# Patient Record
Sex: Female | Born: 1970 | Race: Black or African American | Hispanic: No | Smoking: Never smoker
Health system: Southern US, Community
[De-identification: ages and names within clinical notes are randomized; demographics above are authoritative.]

## PROBLEM LIST (undated history)

## (undated) DIAGNOSIS — T7840XA Allergy, unspecified, initial encounter: Secondary | ICD-10-CM

## (undated) DIAGNOSIS — I10 Essential (primary) hypertension: Secondary | ICD-10-CM

## (undated) DIAGNOSIS — M1990 Unspecified osteoarthritis, unspecified site: Secondary | ICD-10-CM

## (undated) DIAGNOSIS — E785 Hyperlipidemia, unspecified: Secondary | ICD-10-CM

## (undated) DIAGNOSIS — F419 Anxiety disorder, unspecified: Secondary | ICD-10-CM

## (undated) DIAGNOSIS — M4316 Spondylolisthesis, lumbar region: Secondary | ICD-10-CM

## (undated) DIAGNOSIS — J45909 Unspecified asthma, uncomplicated: Secondary | ICD-10-CM

## (undated) DIAGNOSIS — D649 Anemia, unspecified: Secondary | ICD-10-CM

## (undated) HISTORY — DX: Spondylolisthesis, lumbar region: M43.16

## (undated) HISTORY — DX: Allergy, unspecified, initial encounter: T78.40XA

## (undated) HISTORY — PX: COLONOSCOPY: SHX174

## (undated) HISTORY — PX: PARTIAL HYSTERECTOMY: SHX80

## (undated) HISTORY — DX: Anemia, unspecified: D64.9

## (undated) HISTORY — PX: OTHER SURGICAL HISTORY: SHX169

## (undated) HISTORY — DX: Hyperlipidemia, unspecified: E78.5

## (undated) HISTORY — DX: Unspecified asthma, uncomplicated: J45.909

## (undated) HISTORY — DX: Unspecified osteoarthritis, unspecified site: M19.90

## (undated) HISTORY — DX: Essential (primary) hypertension: I10

## (undated) HISTORY — PX: LUMBAR DISC SURGERY: SHX700

## (undated) HISTORY — DX: Anxiety disorder, unspecified: F41.9

---

## 2013-04-01 LAB — LIPID PANEL
Cholesterol: 194 mg/dL (ref 0–200)
HDL: 58 mg/dL (ref 35–70)
LDL Cholesterol: 118 mg/dL
Triglycerides: 125 mg/dL (ref 40–160)

## 2015-03-15 ENCOUNTER — Ambulatory Visit: Payer: Self-pay | Admitting: Family Medicine

## 2015-03-15 ENCOUNTER — Encounter: Payer: Self-pay | Admitting: Obstetrics & Gynecology

## 2015-04-19 ENCOUNTER — Encounter: Payer: Self-pay | Admitting: Family Medicine

## 2015-04-19 ENCOUNTER — Ambulatory Visit (INDEPENDENT_AMBULATORY_CARE_PROVIDER_SITE_OTHER): Payer: BLUE CROSS/BLUE SHIELD

## 2015-04-19 ENCOUNTER — Ambulatory Visit (INDEPENDENT_AMBULATORY_CARE_PROVIDER_SITE_OTHER): Payer: BLUE CROSS/BLUE SHIELD | Admitting: Family Medicine

## 2015-04-19 VITALS — BP 135/90 | HR 86 | Ht 64.0 in | Wt 150.0 lb

## 2015-04-19 DIAGNOSIS — Z1239 Encounter for other screening for malignant neoplasm of breast: Secondary | ICD-10-CM

## 2015-04-19 DIAGNOSIS — I10 Essential (primary) hypertension: Secondary | ICD-10-CM | POA: Diagnosis not present

## 2015-04-19 DIAGNOSIS — M5442 Lumbago with sciatica, left side: Secondary | ICD-10-CM

## 2015-04-19 DIAGNOSIS — Z90710 Acquired absence of both cervix and uterus: Secondary | ICD-10-CM

## 2015-04-19 DIAGNOSIS — M545 Low back pain, unspecified: Secondary | ICD-10-CM | POA: Insufficient documentation

## 2015-04-19 DIAGNOSIS — M4316 Spondylolisthesis, lumbar region: Secondary | ICD-10-CM | POA: Diagnosis not present

## 2015-04-19 DIAGNOSIS — R21 Rash and other nonspecific skin eruption: Secondary | ICD-10-CM

## 2015-04-19 MED ORDER — TRIAMCINOLONE ACETONIDE 0.5 % EX OINT: 1.0000 "application " | TOPICAL_OINTMENT | Freq: Two times a day (BID) | CUTANEOUS | Status: DC

## 2015-04-19 MED ORDER — CYCLOBENZAPRINE HCL 5 MG PO TABS
5.0000 mg | ORAL_TABLET | Freq: Three times a day (TID) | ORAL | Status: DC | PRN
Start: 2015-04-19 — End: 2019-01-20

## 2015-04-19 MED ORDER — HYDROCHLOROTHIAZIDE 25 MG PO TABS: 25.0000 mg | ORAL_TABLET | Freq: Every day | ORAL | Status: DC

## 2015-04-19 MED ORDER — AMLODIPINE BESYLATE 10 MG PO TABS: 10.0000 mg | ORAL_TABLET | Freq: Every day | ORAL | Status: DC

## 2015-04-19 NOTE — Assessment & Plan Note (Signed)
Likely due to SI joint dysfunction or lumbosacral strain. Patient may have a component of sciatica. Discussed options. Lumbar x-ray pending. Additionally we'll use Flexeril and Tylenol or ibuprofen for pain control. Refer to physical therapy. Return in 4 weeks for recheck and reevaluation

## 2015-04-19 NOTE — Assessment & Plan Note (Signed)
Restart hydrochlorothiazide. Return in one month for recheck blood pressure. Suggest fasting CMP and lipid and CBC pending

## 2015-04-19 NOTE — Assessment & Plan Note (Signed)
Unclear etiology at this time. Trial triamcinolone ointment

## 2015-04-19 NOTE — Progress Notes (Signed)
Lindsay George is a 44 y.o. female who presents to [facility]  today for establish care and address several medical issues.  1) hypertension: Patient notes hypertension typically controlled with amlodipine and hydrochlorothiazide. She's been out of her hydrochlorothiazide for several days now. No chest pains palpitations or shortness of breath.   2) low back pain: Patient notes left-sided low back pain. The pain radiates to the posterior left knee. This is been ongoing for years off and on but worsened over the last several months to weeks. She notes occasional numbness and pain radiating down the posterior leg. She denies any weakness bowel bladder dysfunction or difficulty walking. She typically takes Tylenol or Advil or Aleve at bedtime which seems to help some. The past she she is Flexeril which helped a lot.  3) rash: Patient notes an itchy rash on the right back. In the past she's had interlesional steroid injections which have helped very well. It's been a while since she's had an injection and would like one soon if possible.  4) breast cancer screening: Patient is due for mammogram. Her mother was diagnosed with breast cancer at 46 years old  51) cervical cancer screening. Patient has had a hysterectomy and no longer has a cervix. The hysterectomy was performed due to fibroids.   Past Medical History  Diagnosis Date  . Hypertension    History reviewed. No pertinent past surgical history. Social History  Substance Use Topics  . Smoking status: Never Smoker   . Smokeless tobacco: Not on file  . Alcohol Use: 0.0 oz/week    0 Standard drinks or equivalent per week   family history includes Hypertension in her brother, daughter, father, maternal aunt, maternal grandfather, maternal grandmother, maternal uncle, mother, paternal aunt, paternal grandfather, paternal grandmother, paternal uncle, sister, and son.  ROS as above Medications: Current  Outpatient Prescriptions  Medication Sig Dispense Refill  . amLODipine (NORVASC) 10 MG tablet Take 1 tablet (10 mg total) by mouth daily. 90 tablet 2  . cyclobenzaprine (FLEXERIL) 5 MG tablet Take 1 tablet (5 mg total) by mouth 3 (three) times daily as needed for muscle spasms. 90 tablet 1  . hydrochlorothiazide (HYDRODIURIL) 25 MG tablet Take 1 tablet (25 mg total) by mouth daily. 90 tablet 2  . triamcinolone ointment (KENALOG) 0.5 % Apply 1 application topically 2 (two) times daily. To affected area, avoid eyes and face 30 g 3   No current facility-administered medications for this visit.   No Known Allergies   Exam:  BP 135/90 mmHg  Pulse 86  Ht 5\' 4"  (1.626 m)  Wt 150 lb (68.04 kg)  BMI 25.73 kg/m2 Gen: Well NAD HEENT: EOMI,  MMM Lungs: Normal work of breathing. CTABL Heart: RRR no MRG Abd: NABS, Soft. Nondistended, Nontender Exts: Brisk capillary refill, warm and well perfused.  Back: Nontender to midline. Normal range of motion pain with flexion Tender palpation left SI joint. Negative Faber test bilaterally. Positive crossover pretzel stretch bilaterally. Negative straight leg raise test bilaterally. Lower extremity strength and reflexes are equal and normal bilaterally. Normal sensation throughout. Normal gait. Skin: Right posterior back small erythematous papule. Nontender.  No results found for this or any previous visit (from the past 24 hour(s)). No results found.   Please see individual assessment and plan sections.

## 2015-04-19 NOTE — Patient Instructions (Addendum)
Thank you for coming in today.  1) blood pressure: Take amlodipine and hydrochlorothiazide. Get fasting labs in the near future. Return in 4 weeks for recheck  2) back pain: Continue cyclobenzaprine. Take Tylenol or ibuprofen or Aleve for pain. Attend physical therapy. Return in 2-4 weeks. X-ray today  3) rash: Apply triamcinolone ointment as needed  4) get a mammogram. Will call with appointment.  Hypertension Hypertension, commonly called high blood pressure, is when the force of blood pumping through your arteries is too strong. Your arteries are the blood vessels that carry blood from your heart throughout your body. A blood pressure reading consists of a higher number over a lower number, such as 110/72. The higher number (systolic) is the pressure inside your arteries when your heart pumps. The lower number (diastolic) is the pressure inside your arteries when your heart relaxes. Ideally you want your blood pressure below 120/80. Hypertension forces your heart to work harder to pump blood. Your arteries may become narrow or stiff. Having hypertension puts you at risk for heart disease, stroke, and other problems.  RISK FACTORS Some risk factors for high blood pressure are controllable. Others are not.  Risk factors you cannot control include:   Race. You may be at higher risk if you are African American.  Age. Risk increases with age.  Gender. Men are at higher risk than women before age 15 years. After age 50, women are at higher risk than men. Risk factors you can control include:  Not getting enough exercise or physical activity.  Being overweight.  Getting too much fat, sugar, calories, or salt in your diet.  Drinking too much alcohol. SIGNS AND SYMPTOMS Hypertension does not usually cause signs or symptoms. Extremely high blood pressure (hypertensive crisis) may cause headache, anxiety, shortness of breath, and nosebleed. DIAGNOSIS  To check if you have hypertension, your  health care provider will measure your blood pressure while you are seated, with your arm held at the level of your heart. It should be measured at least twice using the same arm. Certain conditions can cause a difference in blood pressure between your right and left arms. A blood pressure reading that is higher than normal on one occasion does not mean that you need treatment. If one blood pressure reading is high, ask your health care provider about having it checked again. TREATMENT  Treating high blood pressure includes making lifestyle changes and possibly taking medicine. Living a healthy lifestyle can help lower high blood pressure. You may need to change some of your habits. Lifestyle changes may include:  Following the DASH diet. This diet is high in fruits, vegetables, and whole grains. It is low in salt, red meat, and added sugars.  Getting at least 2 hours of brisk physical activity every week.  Losing weight if necessary.  Not smoking.  Limiting alcoholic beverages.  Learning ways to reduce stress. If lifestyle changes are not enough to get your blood pressure under control, your health care provider may prescribe medicine. You may need to take more than one. Work closely with your health care provider to understand the risks and benefits. HOME CARE INSTRUCTIONS  Have your blood pressure rechecked as directed by your health care provider.   Take medicines only as directed by your health care provider. Follow the directions carefully. Blood pressure medicines must be taken as prescribed. The medicine does not work as well when you skip doses. Skipping doses also puts you at risk for problems.   Do  not smoke.   Monitor your blood pressure at home as directed by your health care provider. SEEK MEDICAL CARE IF:   You think you are having a reaction to medicines taken.  You have recurrent headaches or feel dizzy.  You have swelling in your ankles.  You have trouble  with your vision. SEEK IMMEDIATE MEDICAL CARE IF:  You develop a severe headache or confusion.  You have unusual weakness, numbness, or feel faint.  You have severe chest or abdominal pain.  You vomit repeatedly.  You have trouble breathing. MAKE SURE YOU:   Understand these instructions.  Will watch your condition.  Will get help right away if you are not doing well or get worse. Document Released: 08/11/2005 Document Revised: 12/26/2013 Document Reviewed: 06/03/2013 [facility] Patient Information 2015 [place], [place]. This information is not intended to replace advice given to you by your health care provider. Make sure you discuss any questions you have with your health care provider.

## 2015-04-19 NOTE — Assessment & Plan Note (Signed)
Mammogram ordered

## 2015-04-20 NOTE — Progress Notes (Signed)
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 Continue with PT. If not better MRI will be helpful ______

## 2015-05-03 ENCOUNTER — Ambulatory Visit (INDEPENDENT_AMBULATORY_CARE_PROVIDER_SITE_OTHER): Payer: BLUE CROSS/BLUE SHIELD | Admitting: Rehabilitative and Restorative Service Providers"

## 2015-05-03 ENCOUNTER — Encounter: Payer: Self-pay | Admitting: Rehabilitative and Restorative Service Providers"

## 2015-05-03 DIAGNOSIS — M545 Low back pain, unspecified: Secondary | ICD-10-CM

## 2015-05-03 DIAGNOSIS — M79605 Pain in left leg: Principal | ICD-10-CM
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
## 2015-05-11 ENCOUNTER — Encounter: Payer: Self-pay | Admitting: Family Medicine

## 2015-05-11 ENCOUNTER — Ambulatory Visit (INDEPENDENT_AMBULATORY_CARE_PROVIDER_SITE_OTHER): Payer: BLUE CROSS/BLUE SHIELD | Admitting: Family Medicine

## 2015-05-11 VITALS — BP 142/94 | HR 65 | Wt 151.0 lb

## 2015-05-11 DIAGNOSIS — M545 Low back pain, unspecified: Secondary | ICD-10-CM

## 2015-05-11 DIAGNOSIS — M4306 Spondylolysis, lumbar region: Secondary | ICD-10-CM | POA: Diagnosis not present

## 2015-05-11 DIAGNOSIS — M7072 Other bursitis of hip, left hip: Secondary | ICD-10-CM

## 2015-05-11 NOTE — Patient Instructions (Signed)
Thank you for coming in today. Call or go to the ER if you develop a large red swollen joint with extreme pain or oozing puss.  Return in 1-2 weeks.   Hip Bursitis Bursitis is a swelling and soreness (inflammation) of a fluid-filled sac (bursa). This sac overlies and protects the joints.  CAUSES   Injury.  Overuse of the muscles surrounding the joint.  Arthritis.  Gout.  Infection.  Cold weather.  Inadequate warm-up and conditioning prior to activities. The cause may not be known.  SYMPTOMS   Mild to severe irritation.  Tenderness and swelling over the outside of the hip.  Pain with motion of the hip.  If the bursa becomes infected, a fever may be present. Redness, tenderness, and warmth will develop over the hip. Symptoms usually lessen in 3 to 4 weeks with treatment, but can come back. TREATMENT If conservative treatment does not work, your caregiver may advise draining the bursa and injecting cortisone into the area. This may speed up the healing process. This may also be used as an initial treatment of choice. HOME CARE INSTRUCTIONS   Apply ice to the affected area for 15-20 minutes every 3 to 4 hours while awake for the first 2 days. Put the ice in a plastic bag and place a towel between the bag of ice and your skin.  Rest the painful joint as much as possible, but continue to put the joint through a normal range of motion at least 4 times per day. When the pain lessens, begin normal, slow movements and usual activities to help prevent stiffness of the hip.  Only take over-the-counter or prescription medicines for pain, discomfort, or fever as directed by your caregiver.  Use crutches to limit weight bearing on the hip joint, if advised.  Elevate your painful hip to reduce swelling. Use pillows for propping and cushioning your legs and hips.  Gentle massage may provide comfort and decrease swelling. SEEK IMMEDIATE MEDICAL CARE IF:   Your pain increases even  during treatment, or you are not improving.  You have a fever.  You have heat and inflammation over the involved bursa.  You have any other questions or concerns. MAKE SURE YOU:   Understand these instructions.  Will watch your condition.  Will get help right away if you are not doing well or get worse. Document Released: 01/31/2002 Document Revised: 11/03/2011 Document Reviewed: 08/30/2008 [facility] Patient Information 2015 [place], [place]. This information is not intended to replace advice given to you by your health care provider. Make sure you discuss any questions you have with your health care provider.

## 2015-05-11 NOTE — Assessment & Plan Note (Signed)
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 I still think this is causing most of her pain.

## 2015-05-11 NOTE — Assessment & Plan Note (Signed)
Likely not a predominant pain generator. Plan for watchful waiting. We'll obtain MRI if needed.

## 2015-05-11 NOTE — Progress Notes (Signed)
Lindsay George is a 44 y.o. female who presents to [facility]  today for Back and hip pain. Patient has an ongoing history of left-sided low back pain and left lateral hip pain. Symptoms present for years but worsened recently. Patient was seen in the clinic over a month ago. She notes that she does not want to try physical therapy assistant in the past and has not helped. She would like to have an injection today if possible. The majority of her pain is in her left lateral hip. She denies anyfevers chills nausea vomiting or diarrhea.   Past Medical History  Diagnosis Date  . Hypertension    No past surgical history on file. Social History  Substance Use Topics  . Smoking status: Never Smoker   . Smokeless tobacco: Not on file  . Alcohol Use: 0.0 oz/week    0 Standard drinks or equivalent per week   family history includes Breast cancer (age of onset: 108) in her mother; Hypertension in her brother, daughter, father, maternal aunt, maternal grandfather, maternal grandmother, maternal uncle, mother, paternal aunt, paternal grandfather, paternal grandmother, paternal uncle, sister, and son.  ROS as above Medications: Current Outpatient Prescriptions  Medication Sig Dispense Refill  . amLODipine (NORVASC) 10 MG tablet Take 1 tablet (10 mg total) by mouth daily. 90 tablet 2  . cyclobenzaprine (FLEXERIL) 5 MG tablet Take 1 tablet (5 mg total) by mouth 3 (three) times daily as needed for muscle spasms. 90 tablet 1  . hydrochlorothiazide (HYDRODIURIL) 25 MG tablet Take 1 tablet (25 mg total) by mouth daily. 90 tablet 2  . triamcinolone ointment (KENALOG) 0.5 % Apply 1 application topically 2 (two) times daily. To affected area, avoid eyes and face 30 g 3   No current facility-administered medications for this visit.   No Known Allergies   Exam:  BP 142/94 mmHg  Pulse 65  Wt 151 lb (68.493 kg) Gen: Well NAD HEENT: EOMI,  MMM Lungs: Normal work of  breathing. CTABL Heart: RRR no MRG Abd: NABS, Soft. Nondistended, Nontender Exts: Brisk capillary refill, warm and well perfused.  Back: Nontender to midline. Mildly tender palpation left SI joint. Low back range of motion is normal. Mildly positive left pretzel stretch. Negative Fabere and straight leg raise test bilaterally. Hip normal range of motion. Tender to palpation left lateral greater trochanter. Hip abduction strength is decreased on the left 4+/5. Lower extremity reflexes and strength are intact and equal and normal bilaterally with the exception of hip abduction strength.  Procedure note greater trochanteric bursa injection: Left Consent obtained and timeout performed.  Patient laying on side with affected hip up.   Area located and marked.   Skin cleaned with alcohol and chlorhexidine, and cold spray applied Using a spinal needle the greater trochanteric bursa was accessed and the needle tip was touched to the femur.   40 mg of Kenalog and 4 mL of Marcaine were injected into a wheel pattern.  Patient tolerated procedure well with no weakness or numbness or bleeding.     No results found for this or any previous visit (from the past 24 hour(s)). No results found.   Please see individual assessment and plan sections.

## 2015-05-11 NOTE — Assessment & Plan Note (Signed)
Greater trochanteric bursitis treated with injection today. Work on home exercises return in one week.

## 2015-05-12 LAB — CBC
HCT: 38.7 % (ref 36.0–46.0)
Hemoglobin: 13.1 g/dL (ref 12.0–15.0)
MCH: 29.9 pg (ref 26.0–34.0)
MCHC: 33.9 g/dL (ref 30.0–36.0)
MCV: 88.4 fL (ref 78.0–100.0)
MPV: 9.1 fL (ref 8.6–12.4)
PLATELETS: 306 10*3/uL (ref 150–400)
RBC: 4.38 MIL/uL (ref 3.87–5.11)
RDW: 16.7 % — AB (ref 11.5–15.5)
WBC: 7.5 10*3/uL (ref 4.0–10.5)

## 2015-05-12 LAB — COMPREHENSIVE METABOLIC PANEL
ALT: 9 U/L (ref 6–29)
AST: 12 U/L (ref 10–35)
Albumin: 4 g/dL (ref 3.6–5.1)
Alkaline Phosphatase: 64 U/L (ref 33–115)
BUN: 15 mg/dL (ref 7–25)
CALCIUM: 9.4 mg/dL (ref 8.6–10.2)
CO2: 29 mmol/L (ref 20–31)
Chloride: 103 mmol/L (ref 98–110)
Creat: 0.81 mg/dL (ref 0.50–1.10)
GLUCOSE: 86 mg/dL (ref 65–99)
POTASSIUM: 3 mmol/L — AB (ref 3.5–5.3)
Sodium: 140 mmol/L (ref 135–146)
Total Bilirubin: 0.4 mg/dL (ref 0.2–1.2)
Total Protein: 6.6 g/dL (ref 6.1–8.1)

## 2015-05-12 LAB — LIPID PANEL
CHOL/HDL RATIO: 3.4 ratio (ref <=5.0)
Cholesterol: 188 mg/dL (ref 125–200)
HDL: 55 mg/dL (ref >=46)
LDL CALC: 115 mg/dL (ref <130)
Triglycerides: 90 mg/dL (ref <150)
VLDL: 18 mg/dL (ref <30)

## 2015-05-14 NOTE — Progress Notes (Signed)
Quick Note:  Labs show slightly high cholesterol and low potassium.  Will discuss what we need to do at the next visit.  May consider start lipitor. ______

## 2015-05-17 ENCOUNTER — Ambulatory Visit: Payer: BLUE CROSS/BLUE SHIELD | Admitting: Family Medicine

## 2015-05-25 ENCOUNTER — Ambulatory Visit: Payer: BLUE CROSS/BLUE SHIELD | Admitting: Family Medicine

## 2015-05-31 ENCOUNTER — Encounter: Payer: Self-pay | Admitting: Family Medicine

## 2015-05-31 DIAGNOSIS — L910 Hypertrophic scar: Secondary | ICD-10-CM | POA: Insufficient documentation

## 2015-06-05 ENCOUNTER — Encounter: Payer: Self-pay | Admitting: Family Medicine

## 2015-07-02 ENCOUNTER — Ambulatory Visit: Payer: BLUE CROSS/BLUE SHIELD | Admitting: Family Medicine

## 2015-07-03 ENCOUNTER — Ambulatory Visit (INDEPENDENT_AMBULATORY_CARE_PROVIDER_SITE_OTHER): Payer: BLUE CROSS/BLUE SHIELD | Admitting: Family Medicine

## 2015-07-03 ENCOUNTER — Encounter: Payer: Self-pay | Admitting: Family Medicine

## 2015-07-03 ENCOUNTER — Ambulatory Visit: Payer: BLUE CROSS/BLUE SHIELD | Admitting: Family Medicine

## 2015-07-03 VITALS — BP 138/87 | HR 79 | Wt 147.0 lb

## 2015-07-03 DIAGNOSIS — M545 Low back pain, unspecified: Secondary | ICD-10-CM

## 2015-07-03 DIAGNOSIS — M7072 Other bursitis of hip, left hip: Secondary | ICD-10-CM

## 2015-07-03 DIAGNOSIS — M4306 Spondylolysis, lumbar region: Secondary | ICD-10-CM

## 2015-07-03 MED ORDER — TRAMADOL HCL 50 MG PO TABS: 50.0000 mg | ORAL_TABLET | Freq: Three times a day (TID) | ORAL | Status: DC | PRN

## 2015-07-03 NOTE — Progress Notes (Signed)
Lindsay George is a 44 y.o. female who presents to [facility]  today for back and hip pain. Patient was last seen in middle September for back and hip pain.   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 Additionally she was found to have greater trochanteric bursitis. She was not interested in physical therapy. In September she received an injection for her greater trochanter which helped up until recently. She did some home exercises which have been unhelpful little. Her pain in her lateral hip has returned. She notes severe pain in the lateral hip causes pain with laying on her left side and unsteady from a seated position. Additionally she notes the pain radiating to the lateral knee. She notes infrequent pain radiating to the foot. She denies any weakness or numbness bowel bladder dysfunction.   Past Medical History  Diagnosis Date  . Hypertension   . History of hysterectomy    Past Surgical History  Procedure Laterality Date  . [place] rso 2012 for uterine fibroids     Social History  Substance Use Topics  . Smoking status: Never Smoker   . Smokeless tobacco: Not on file  . Alcohol Use: 0.0 oz/week    0 Standard drinks or equivalent per week   family history includes Breast cancer (age of onset: 86) in her mother; Hypertension in her brother, daughter, father, maternal aunt, maternal grandfather, maternal grandmother, maternal uncle, mother, paternal aunt, paternal grandfather, paternal grandmother, paternal uncle, sister, and son.  ROS as above Medications: Current Outpatient Prescriptions  Medication Sig Dispense Refill  . amLODipine (NORVASC) 10 MG tablet Take 1 tablet (10 mg total) by mouth daily. 90 tablet 2  . cyclobenzaprine (FLEXERIL) 5 MG tablet Take 1 tablet (5 mg total) by mouth 3 (three) times daily as needed for muscle spasms. 90 tablet 1  . hydrochlorothiazide (HYDRODIURIL) 25 MG tablet Take 1 tablet (25 mg  total) by mouth daily. 90 tablet 2  . triamcinolone ointment (KENALOG) 0.5 % Apply 1 application topically 2 (two) times daily. To affected area, avoid eyes and face 30 g 3  . traMADol (ULTRAM) 50 MG tablet Take 1 tablet (50 mg total) by mouth every 8 (eight) hours as needed. 30 tablet 0   No current facility-administered medications for this visit.   Allergies  Allergen Reactions  . Ace Inhibitors Cough    Cough     Exam:  BP 138/87 mmHg  Pulse 79  Wt 147 lb (66.679 kg) Gen: Well NAD HEENT: EOMI,  MMM Lungs: Normal work of breathing. CTABL Heart: RRR no MRG Abd: NABS, Soft. Nondistended, Nontender Exts: Brisk capillary refill, warm and well perfused.  Back nontender to spinal midline.  Hip normal range of motion bilaterally. Left hip is tender to palpation overlying the greater trochanter. Hip abduction strength is 4/5 left. Pulses capillary refill sensation are intact distally. Antalgic gait  Greater trochanteric bursa injection: Left Consent obtained and timeout performed.  Patient laying on side with affected hip up.   Area located and marked.   Skin cleaned with rubbing alcohol and chlorhexidine, and cold spray applied Using a spinal needle the greater trochanteric bursa was accessed.   40 mg of Kenalog and 4 mL of Marcaine were injected in a wheel pattern.   Immediate improvement following injection:  Patient tolerated procedure well with no weakness or numbness or bleeding.     No results found for this or any previous visit (from  the past 24 hour(s)). No results found.   Please see individual assessment and plan sections.

## 2015-07-03 NOTE — Assessment & Plan Note (Signed)
Greater trochanteric bursitis returned. I don't think patient was able to do sufficient amount of home exercises and therapy. She agrees to attend formal physical therapy. Additionally will work on core strength for pars defect. Treat pain with tramadol. Return in a few weeks.

## 2015-07-03 NOTE — Patient Instructions (Signed)
Thank you for coming in today. Come back or go to the emergency room if you notice new weakness new numbness problems walking or bowel or bladder problems. Attend physical therapy.  Take tramadol for severe pain.  Return in a few weeks.

## 2015-07-09 ENCOUNTER — Telehealth: Payer: Self-pay | Admitting: Family Medicine

## 2015-07-09 NOTE — Telephone Encounter (Signed)
Refill not appropriate. Reason: pt has requested refill too soon.  Left detailed VM notifying pt that she should have plenty of refills at her pharmacy and that she should contact her pharmacy to have the rx refilled.

## 2015-07-09 NOTE — Telephone Encounter (Signed)
Pt called for refill on Amlodipine. She uses [facility] on [address] in [place]. Thanks.

## 2015-07-27 ENCOUNTER — Ambulatory Visit (INDEPENDENT_AMBULATORY_CARE_PROVIDER_SITE_OTHER): Payer: BLUE CROSS/BLUE SHIELD | Admitting: Rehabilitative and Restorative Service Providers"

## 2015-07-27 ENCOUNTER — Encounter: Payer: Self-pay | Admitting: Rehabilitative and Restorative Service Providers"

## 2015-07-27 DIAGNOSIS — M545 Low back pain, unspecified: Secondary | ICD-10-CM

## 2015-07-27 DIAGNOSIS — R6889 Other general symptoms and signs: Secondary | ICD-10-CM

## 2015-07-27 DIAGNOSIS — Z7409 Other reduced mobility: Secondary | ICD-10-CM

## 2015-07-27 DIAGNOSIS — M79605 Pain in left leg: Principal | ICD-10-CM

## 2015-07-27 DIAGNOSIS — M623 Immobility syndrome (paraplegic): Secondary | ICD-10-CM

## 2015-07-27 DIAGNOSIS — R531 Weakness: Secondary | ICD-10-CM

## 2015-07-27 DIAGNOSIS — M2560 Stiffness of unspecified joint, not elsewhere classified: Secondary | ICD-10-CM
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
## 2015-07-27 NOTE — Patient Instructions (Signed)
Ball massage work   Abdominal Bracing With Pelvic Floor (Hook-Lying)    With neutral spine, tighten pelvic floor and abdominals sucking belly button to back bone; tighten muscles in back at wiast. Hold 10 sec  Repeat _10 times. Do __several_ times a day. Progress to do this sitting; standing and walking   Trunk: Prone Extension (Press-Ups)    Lie on stomach on firm, flat surface. Relax bottom and legs. Raise chest in air with elbows straight. Keep hips flat on surface, sag stomach. Hold _2-3___ seconds. Repeat _10___ times. Do __2-3_ sessions per day. CAUTION: Movement should be gentle and slow.  KNEE: Quadriceps - Prone    Place strap around ankle. Bring ankle toward buttocks. Press hip into surface. Hold _30__ seconds. __3_ reps per set, _2-3__ sets per day

## 2015-08-02 ENCOUNTER — Encounter: Payer: Self-pay | Admitting: Family Medicine

## 2015-08-02 ENCOUNTER — Ambulatory Visit (INDEPENDENT_AMBULATORY_CARE_PROVIDER_SITE_OTHER): Payer: BLUE CROSS/BLUE SHIELD | Admitting: Family Medicine

## 2015-08-02 DIAGNOSIS — M7072 Other bursitis of hip, left hip: Secondary | ICD-10-CM

## 2015-08-02 NOTE — Progress Notes (Signed)
No show

## 2015-11-08 ENCOUNTER — Ambulatory Visit (INDEPENDENT_AMBULATORY_CARE_PROVIDER_SITE_OTHER): Payer: BLUE CROSS/BLUE SHIELD | Admitting: Family Medicine

## 2015-11-08 ENCOUNTER — Encounter: Payer: Self-pay | Admitting: Family Medicine

## 2015-11-08 VITALS — BP 169/104 | HR 81 | Temp 98.1°F | Wt 152.0 lb

## 2015-11-08 DIAGNOSIS — R6889 Other general symptoms and signs: Secondary | ICD-10-CM

## 2015-11-08 LAB — POCT INFLUENZA A/B
INFLUENZA B, POC: NEGATIVE
Influenza A, POC: NEGATIVE

## 2015-11-08 MED ORDER — PREDNISONE 10 MG PO TABS: 30.0000 mg | ORAL_TABLET | Freq: Every day | ORAL | Status: DC

## 2015-11-08 MED ORDER — AZITHROMYCIN 250 MG PO TABS: 250.0000 mg | ORAL_TABLET | Freq: Every day | ORAL | Status: DC

## 2015-11-08 MED ORDER — IPRATROPIUM BROMIDE 0.06 % NA SOLN: 2.0000 | NASAL | Status: DC | PRN

## 2015-11-08 MED ORDER — GUAIFENESIN-CODEINE 100-10 MG/5ML PO SOLN: 5.0000 mL | Freq: Every evening | ORAL | Status: DC | PRN

## 2015-11-08 NOTE — Progress Notes (Signed)
       Lindsay George is a 45 y.o. female who presents to [facility] today for cough congestion and runny nose and watery eyes body aches chills and subjective fevers. Symptoms present for 2 days. Additionally patient has right-sided ear pain and pressure and decreased hearing. Patient has tried multiple over-the-counter cough and cold medicines which probably helped a little. She did not take her blood pressure medications this morning.   Past Medical History  Diagnosis Date  . Hypertension   . History of hysterectomy    Past Surgical History  Procedure Laterality Date  . Tah rso 2012 for uterine fibroids     Social History  Substance Use Topics  . Smoking status: Never Smoker   . Smokeless tobacco: Not on file  . Alcohol Use: 0.0 oz/week    0 Standard drinks or equivalent per week   family history includes Breast cancer (age of onset: 57) in her mother; Hypertension in her brother, daughter, father, maternal aunt, maternal grandfather, maternal grandmother, maternal uncle, mother, paternal aunt, paternal grandfather, paternal grandmother, paternal uncle, sister, and son.  ROS as above Medications: Current Outpatient Prescriptions  Medication Sig Dispense Refill  . amLODipine (NORVASC) 10 MG tablet Take 1 tablet (10 mg total) by mouth daily. 90 tablet 2  . cyclobenzaprine (FLEXERIL) 5 MG tablet Take 1 tablet (5 mg total) by mouth 3 (three) times daily as needed for muscle spasms. 90 tablet 1  . hydrochlorothiazide (HYDRODIURIL) 25 MG tablet Take 1 tablet (25 mg total) by mouth daily. 90 tablet 2  . traMADol (ULTRAM) 50 MG tablet Take 1 tablet (50 mg total) by mouth every 8 (eight) hours as needed. 30 tablet 0  . triamcinolone ointment (KENALOG) 0.5 % Apply 1 application topically 2 (two) times daily. To affected area, avoid eyes and face 30 g 3   No current facility-administered  medications for this visit.   Allergies  Allergen Reactions  . Ace Inhibitors Cough    Cough     Exam:  BP 169/104 mmHg  Pulse 81  Temp(Src) 98.1 F (36.7 C) (Oral)  Wt 152 lb (68.947 kg)  SpO2 100% Gen: Well NAD Nontoxic appearing HEENT: EOMI,  MMM clear nasal discharge. Posterior pharynx with cobblestoning. Normal tympanic membranes bilaterally. Cervical lymphadenopathy present bilaterally Lungs: Normal work of breathing. CTABL Heart: RRR no MRG Abd: NABS, Soft. Nondistended, Nontender Exts: Brisk capillary refill, warm and well perfused.   Point of care rapid influenza test: negative  No results found for this or any previous visit (from the past 24 hour(s)). No results found.    45 year old woman With viral URI. Flu test negative.  Treatment with prednisone and Atrovent nasal spray and codeine cough syrup. Use azithromycin and Avelox if not better.

## 2015-11-08 NOTE — Patient Instructions (Signed)
Thank you for coming in today. Call or go to the emergency room if you get worse, have trouble breathing, have chest pains, or palpitations.  Take the prednisone daily.  Use the nasal spray and cough medicine as needed.  Take azithromycin antibiotics if not getting better.  Take tylenol for pain and fever.  Take your blood pressure medicines.   Upper Respiratory Infection, Adult Most upper respiratory infections (URIs) are a viral infection of the air passages leading to the lungs. A URI affects the nose, throat, and upper air passages. The most common type of URI is nasopharyngitis and is typically referred to as "the common cold." URIs run their course and usually go away on their own. Most of the time, a URI does not require medical attention, but sometimes a bacterial infection in the upper airways can follow a viral infection. This is called a secondary infection. Sinus and middle ear infections are common types of secondary upper respiratory infections. Bacterial pneumonia can also complicate a URI. A URI can worsen asthma and chronic obstructive pulmonary disease (COPD). Sometimes, these complications can require emergency medical care and may be life threatening.  CAUSES Almost all URIs are caused by viruses. A virus is a type of germ and can spread from one person to another.  RISKS FACTORS You may be at risk for a URI if:   You smoke.   You have chronic heart or lung disease.  You have a weakened defense (immune) system.   You are very young or very old.   You have nasal allergies or asthma.  You work in crowded or poorly ventilated areas.  You work in health care facilities or schools. SIGNS AND SYMPTOMS  Symptoms typically develop 2-3 days after you come in contact with a cold virus. Most viral URIs last 7-10 days. However, viral URIs from the influenza virus (flu virus) can last 14-18 days and are typically more severe. Symptoms may include:   Runny or stuffy  (congested) nose.   Sneezing.   Cough.   Sore throat.   Headache.   Fatigue.   Fever.   Loss of appetite.   Pain in your forehead, behind your eyes, and over your cheekbones (sinus pain).  Muscle aches.  DIAGNOSIS  Your health care provider may diagnose a URI by:  Physical exam.  Tests to check that your symptoms are not due to another condition such as:  Strep throat.  Sinusitis.  Pneumonia.  Asthma. TREATMENT  A URI goes away on its own with time. It cannot be cured with medicines, but medicines may be prescribed or recommended to relieve symptoms. Medicines may help:  Reduce your fever.  Reduce your cough.  Relieve nasal congestion. HOME CARE INSTRUCTIONS   Take medicines only as directed by your health care provider.   Gargle warm saltwater or take cough drops to comfort your throat as directed by your health care provider.  Use a warm mist humidifier or inhale steam from a shower to increase air moisture. This may make it easier to breathe.  Drink enough fluid to keep your urine clear or pale yellow.   Eat soups and other clear broths and maintain good nutrition.   Rest as needed.   Return to work when your temperature has returned to normal or as your health care provider advises. You may need to stay home longer to avoid infecting others. You can also use a face mask and careful hand washing to prevent spread of the virus.  Increase the usage of your inhaler if you have asthma.   Do not use any tobacco products, including cigarettes, chewing tobacco, or electronic cigarettes. If you need help quitting, ask your health care provider. PREVENTION  The best way to protect yourself from getting a cold is to practice good hygiene.   Avoid oral or hand contact with people with cold symptoms.   Wash your hands often if contact occurs.  There is no clear evidence that vitamin C, vitamin E, echinacea, or exercise reduces the chance of  developing a cold. However, it is always recommended to get plenty of rest, exercise, and practice good nutrition.  SEEK MEDICAL CARE IF:   You are getting worse rather than better.   Your symptoms are not controlled by medicine.   You have chills.  You have worsening shortness of breath.  You have brown or red mucus.  You have yellow or brown nasal discharge.  You have pain in your face, especially when you bend forward.  You have a fever.  You have swollen neck glands.  You have pain while swallowing.  You have white areas in the back of your throat. SEEK IMMEDIATE MEDICAL CARE IF:   You have severe or persistent:  Headache.  Ear pain.  Sinus pain.  Chest pain.  You have chronic lung disease and any of the following:  Wheezing.  Prolonged cough.  Coughing up blood.  A change in your usual mucus.  You have a stiff neck.  You have changes in your:  Vision.  Hearing.  Thinking.  Mood. MAKE SURE YOU:   Understand these instructions.  Will watch your condition.  Will get help right away if you are not doing well or get worse.   This information is not intended to replace advice given to you by your health care provider. Make sure you discuss any questions you have with your health care provider.   Document Released: 02/04/2001 Document Revised: 12/26/2014 Document Reviewed: 11/16/2013 Elsevier Interactive Patient Education [employer].

## 2015-11-21 ENCOUNTER — Other Ambulatory Visit: Payer: Self-pay | Admitting: Family Medicine

## 2015-11-21 MED ORDER — FLUCONAZOLE 150 MG PO TABS: ORAL_TABLET | ORAL | Status: DC

## 2016-05-24 ENCOUNTER — Other Ambulatory Visit: Payer: Self-pay | Admitting: Family Medicine

## 2016-06-06 ENCOUNTER — Other Ambulatory Visit: Payer: Self-pay | Admitting: Family Medicine

## 2016-06-13 ENCOUNTER — Ambulatory Visit (INDEPENDENT_AMBULATORY_CARE_PROVIDER_SITE_OTHER): Payer: BLUE CROSS/BLUE SHIELD

## 2016-06-13 DIAGNOSIS — R921 Mammographic calcification found on diagnostic imaging of breast: Secondary | ICD-10-CM | POA: Diagnosis not present

## 2016-06-24 ENCOUNTER — Telehealth: Payer: Self-pay | Admitting: Family Medicine

## 2016-06-24 DIAGNOSIS — R928 Other abnormal and inconclusive findings on diagnostic imaging of breast: Secondary | ICD-10-CM

## 2016-06-24 NOTE — Telephone Encounter (Signed)
Screening mammogram abnormal. Diagnostic mammogram and ultrasound ordered.

## 2016-06-24 NOTE — Telephone Encounter (Signed)
Pt.notified

## 2016-07-15 ENCOUNTER — Other Ambulatory Visit: Payer: Self-pay | Admitting: Family Medicine

## 2016-07-15 ENCOUNTER — Ambulatory Visit
Admission: RE | Admit: 2016-07-15 | Discharge: 2016-07-15 | Disposition: A | Discharge status: Home / Self Care | Payer: BLUE CROSS/BLUE SHIELD | Source: Ambulatory Visit | Attending: Family Medicine | Admitting: Family Medicine

## 2016-07-15 DIAGNOSIS — R928 Other abnormal and inconclusive findings on diagnostic imaging of breast: Secondary | ICD-10-CM

## 2016-07-22 ENCOUNTER — Other Ambulatory Visit: Payer: Self-pay | Admitting: Family Medicine

## 2016-07-22 ENCOUNTER — Ambulatory Visit
Admission: RE | Admit: 2016-07-22 | Discharge: 2016-07-22 | Disposition: A | Discharge status: Home / Self Care | Payer: BLUE CROSS/BLUE SHIELD | Source: Ambulatory Visit | Attending: Family Medicine | Admitting: Family Medicine

## 2016-07-22 DIAGNOSIS — R928 Other abnormal and inconclusive findings on diagnostic imaging of breast: Secondary | ICD-10-CM

## 2016-07-22 DIAGNOSIS — R921 Mammographic calcification found on diagnostic imaging of breast: Secondary | ICD-10-CM

## 2016-08-15 ENCOUNTER — Encounter: Payer: Self-pay | Admitting: Family Medicine

## 2016-08-15 ENCOUNTER — Ambulatory Visit (INDEPENDENT_AMBULATORY_CARE_PROVIDER_SITE_OTHER): Payer: BLUE CROSS/BLUE SHIELD | Admitting: Family Medicine

## 2016-08-15 VITALS — BP 164/95 | HR 84 | Temp 98.1°F | Wt 154.0 lb

## 2016-08-15 DIAGNOSIS — B9789 Other viral agents as the cause of diseases classified elsewhere: Secondary | ICD-10-CM

## 2016-08-15 DIAGNOSIS — I10 Essential (primary) hypertension: Secondary | ICD-10-CM | POA: Diagnosis not present

## 2016-08-15 DIAGNOSIS — J069 Acute upper respiratory infection, unspecified: Secondary | ICD-10-CM | POA: Diagnosis not present

## 2016-08-15 DIAGNOSIS — M4306 Spondylolysis, lumbar region: Secondary | ICD-10-CM

## 2016-08-15 DIAGNOSIS — G4700 Insomnia, unspecified: Secondary | ICD-10-CM | POA: Diagnosis not present

## 2016-08-15 MED ORDER — AZITHROMYCIN 250 MG PO TABS: 250.0000 mg | ORAL_TABLET | Freq: Every day | ORAL | 0 refills | Status: DC

## 2016-08-15 MED ORDER — TRAZODONE HCL 50 MG PO TABS: 25.0000 mg | ORAL_TABLET | Freq: Every evening | ORAL | 3 refills | Status: DC | PRN

## 2016-08-15 MED ORDER — AMLODIPINE BESYLATE 10 MG PO TABS: 10.0000 mg | ORAL_TABLET | Freq: Every day | ORAL | 0 refills | Status: DC

## 2016-08-15 MED ORDER — HYDROCHLOROTHIAZIDE 25 MG PO TABS: 25.0000 mg | ORAL_TABLET | Freq: Every day | ORAL | 1 refills | Status: DC

## 2016-08-15 MED ORDER — PREDNISONE 10 MG PO TABS: 30.0000 mg | ORAL_TABLET | Freq: Every day | ORAL | 0 refills | Status: DC

## 2016-08-15 NOTE — Patient Instructions (Signed)
Thank you for coming in today. Attend PT.   Restart Blood pressure medicines.   Take the predisone and azithromycin if not better.   Get labs before you come back.   Return for recheck in about 1 month.    Upper Respiratory Infection, Adult Most upper respiratory infections (URIs) are caused by a virus. A URI affects the nose, throat, and upper air passages. The most common type of URI is often called "the common cold." Follow these instructions at home:  Take medicines only as told by your doctor.  Gargle warm saltwater or take cough drops to comfort your throat as told by your doctor.  Use a warm mist humidifier or inhale steam from a shower to increase air moisture. This may make it easier to breathe.  Drink enough fluid to keep your pee (urine) clear or pale yellow.  Eat soups and other clear broths.  Have a healthy diet.  Rest as needed.  Go back to work when your fever is gone or your doctor says it is okay.  You may need to stay home longer to avoid giving your URI to others.  You can also wear a face mask and wash your hands often to prevent spread of the virus.  Use your inhaler more if you have asthma.  Do not use any tobacco products, including cigarettes, chewing tobacco, or electronic cigarettes. If you need help quitting, ask your doctor. Contact a doctor if:  You are getting worse, not better.  Your symptoms are not helped by medicine.  You have chills.  You are getting more short of breath.  You have brown or red mucus.  You have yellow or brown discharge from your nose.  You have pain in your face, especially when you bend forward.  You have a fever.  You have puffy (swollen) neck glands.  You have pain while swallowing.  You have white areas in the back of your throat. Get help right away if:  You have very bad or constant:  Headache.  Ear pain.  Pain in your forehead, behind your eyes, and over your cheekbones (sinus  pain).  Chest pain.  You have long-lasting (chronic) lung disease and any of the following:  Wheezing.  Long-lasting cough.  Coughing up blood.  A change in your usual mucus.  You have a stiff neck.  You have changes in your:  Vision.  Hearing.  Thinking.  Mood. This information is not intended to replace advice given to you by your health care provider. Make sure you discuss any questions you have with your health care provider. Document Released: 01/28/2008 Document Revised: 04/13/2016 Document Reviewed: 11/16/2013 Elsevier Interactive Patient Education  2017 [employer].

## 2016-08-15 NOTE — Progress Notes (Signed)
Lindsay George is a 45 y.o. female who presents to [facility]: [place] today for  insomnia, nasal congestion, hypertension, back pain.  Insomnia: Patient notes difficulty sleeping recently. She gets off work at about 11:30 or 10:30 most evenings and tries to go to bed by about 1:30. She has difficulty falling asleep however she is able to stay asleep once she finally falls asleep. She feels tired in the morning is that she did not rest well. She takes caffeine frequently up until the point where she goes to bed. She often sleeps with the television on. She will occasionally take Tylenol PM which helps. She does not drink excessively before bed.   Nasal congestion: Patient notes a one-week history of nasal congestion stuffiness runny nose sore throat. She's tried over-the-counter medicines which have helped. No vomiting diarrhea chest pain palpitations or shortness of breath.  Hypertension: Patient has a history of hypertension and has run out of her blood pressure medicines. She feels well most days. She does not check her blood pressure regularly.  Back pain: Patient has chronic intermittent back pain. She has pars defects. She did well with physical therapy about a year ago. She would like to try physical therapy again as her back pain is slowly worsening. Additionally she has FMLA paperwork to fill out for work.   Past Medical History:  Diagnosis Date  . History of hysterectomy   . Hypertension    Past Surgical History:  Procedure Laterality Date  . TAH RSO 2012 for uterine fibroids     Social History  Substance Use Topics  . Smoking status: Never Smoker  . Smokeless tobacco: Not on file  . Alcohol use 0.0 oz/week   family history includes Breast cancer (age of onset: 20) in her mother; Hypertension in her brother, daughter, father, maternal aunt, maternal grandfather,  maternal grandmother, maternal uncle, mother, paternal aunt, paternal grandfather, paternal grandmother, paternal uncle, sister, and son.  ROS as above:  Medications: Current Outpatient Prescriptions  Medication Sig Dispense Refill  . amLODipine (NORVASC) 10 MG tablet Take 1 tablet (10 mg total) by mouth daily. 90 tablet 0  . cyclobenzaprine (FLEXERIL) 5 MG tablet Take 1 tablet (5 mg total) by mouth 3 (three) times daily as needed for muscle spasms. 90 tablet 1  . hydrochlorothiazide (HYDRODIURIL) 25 MG tablet Take 1 tablet (25 mg total) by mouth daily. 90 tablet 1  . ipratropium (ATROVENT) 0.06 % nasal spray Place 2 sprays into both nostrils every 4 (four) hours as needed for rhinitis. 10 mL 6  . traMADol (ULTRAM) 50 MG tablet Take 1 tablet (50 mg total) by mouth every 8 (eight) hours as needed. 30 tablet 0  . triamcinolone ointment (KENALOG) 0.5 % Apply 1 application topically 2 (two) times daily. To affected area, avoid eyes and face 30 g 3  . azithromycin (ZITHROMAX) 250 MG tablet Take 1 tablet (250 mg total) by mouth daily. Take first 2 tablets together, then 1 every day until finished. 6 tablet 0  . predniSONE (DELTASONE) 10 MG tablet Take 3 tablets (30 mg total) by mouth daily with breakfast. 15 tablet 0  . traZODone (DESYREL) 50 MG tablet Take 0.5-1 tablets (25-50 mg total) by mouth at bedtime as needed for sleep. 30 tablet 3   No current facility-administered medications for this visit.    Allergies  Allergen Reactions  . Ace Inhibitors Cough    Cough    Health Maintenance Health  Maintenance  Topic Date Due  . HIV Screening  12/09/1983  . TETANUS/TDAP  12/09/1987  . INFLUENZA VACCINE  03/25/2016     Exam:  BP (!) 164/95   Pulse 84   Temp 98.1 F (36.7 C) (Oral)   Wt 154 lb (69.9 kg)   SpO2 99%   BMI 26.43 kg/m  Gen: Well NAD HEENT: EOMI,  MMMClear nasal discharge. Posterior pharynx with cobblestoning. Normal tympanic membranes bilaterally. No cervical  lymphadenopathy.  Lungs: Normal work of breathing. CTABL Heart: RRR no MRG Abd: NABS, Soft. Nondistended, Nontender Exts: Brisk capillary refill, warm and well perfused.  L spine: Nontender normal back motion.   No results found for this or any previous visit (from the past 72 hour(s)). No results found.    Assessment and Plan: 44 y.o. female with  Insomnia: Poor sleep hygiene. Improve sleep hygiene use trazodone intermittently recheck in about a month.  Hypertension: Not well controlled. Refill blood pressure medications and check metabolic panel and lipid panel and recheck in about a month.  Nasal congestion: Viral URI. Plan for symptomatic management with over-the-counter medications. His prednisone and azithromycin if not better.  Back pain: Chronic due to pars defect. Refer back to physical therapy. If only paperwork filled out.     Orders Placed This Encounter  Procedures  . CBC  . COMPLETE METABOLIC PANEL WITH GFR  . Lipid panel  . Ambulatory referral to Physical Therapy    Referral Priority:   Routine    Referral Type:   Physical Medicine    Referral Reason:   Specialty Services Required    Requested Specialty:   Physical Therapy    Number of Visits Requested:   1    Discussed warning signs or symptoms. Please see discharge instructions. Patient expresses understanding.

## 2016-09-30 ENCOUNTER — Telehealth: Payer: Self-pay | Admitting: Family Medicine

## 2016-09-30 NOTE — Telephone Encounter (Signed)
Called pt unable to leave vm (mailbox full) called about flu shot 09/30/16 @11 :59am-vew

## 2016-12-26 ENCOUNTER — Other Ambulatory Visit: Payer: Self-pay | Admitting: Family Medicine

## 2017-03-03 ENCOUNTER — Other Ambulatory Visit: Payer: Self-pay | Admitting: Family Medicine

## 2017-03-05 ENCOUNTER — Telehealth: Payer: Self-pay | Admitting: Family Medicine

## 2017-03-05 NOTE — Telephone Encounter (Signed)
Called patient and left a message informing pt to call our office and schedule a F/u on HTN and Insomnia with Dr.Corey

## 2017-03-26 ENCOUNTER — Telehealth: Payer: Self-pay | Admitting: *Deleted

## 2017-03-26 DIAGNOSIS — I10 Essential (primary) hypertension: Secondary | ICD-10-CM

## 2017-03-26 NOTE — Telephone Encounter (Signed)
Lab order

## 2017-04-14 ENCOUNTER — Ambulatory Visit (INDEPENDENT_AMBULATORY_CARE_PROVIDER_SITE_OTHER): Payer: BLUE CROSS/BLUE SHIELD | Admitting: Family Medicine

## 2017-04-14 VITALS — BP 156/92 | HR 83 | Wt 149.0 lb

## 2017-04-14 DIAGNOSIS — M4306 Spondylolysis, lumbar region: Secondary | ICD-10-CM

## 2017-04-14 DIAGNOSIS — I10 Essential (primary) hypertension: Secondary | ICD-10-CM | POA: Diagnosis not present

## 2017-04-14 DIAGNOSIS — M7062 Trochanteric bursitis, left hip: Secondary | ICD-10-CM

## 2017-04-14 MED ORDER — AMLODIPINE BESYLATE 10 MG PO TABS: ORAL_TABLET | ORAL | 1 refills | Status: DC

## 2017-04-14 MED ORDER — HYDROCHLOROTHIAZIDE 25 MG PO TABS: 25.0000 mg | ORAL_TABLET | Freq: Every day | ORAL | 1 refills | Status: DC

## 2017-04-14 NOTE — Patient Instructions (Signed)
Thank you for coming in today. Call or go to the ER if you develop a large red swollen joint with extreme pain or oozing puss.  Attend PT.  Recheck in 6 weeks.  Do the laying two stretches  Do the Standing IT band stretch.  Do the side leg raises.    Hip Bursitis Hip bursitis is swelling of a fluid-filled sac (bursa) in your hip. This swelling (inflammation) can be painful. This condition may come and go over time. Follow these instructions at home: Medicines  Take over-the-counter and prescription medicines only as told by your doctor.  Do not drive or use heavy machinery while taking prescription pain medicine, or as told by your doctor.  If you were prescribed an antibiotic medicine, take it as told by your doctor. Do not stop taking the antibiotic even if you start to feel better. Activity  Return to your normal activities as told by your doctor. Ask your doctor what activities are safe for you.  Rest and protect your hip until you feel better. General instructions  Wear wraps that put pressure on your hip (compression wraps) only as told by your doctor.  Raise (elevate) your hip above the level of your heart as much as you can. To do this, try putting a pillow under your hips while you lie down. Stop if this causes pain.  Do not use your hip to support your body weight until your doctor says that you can.  Use crutches as told by your doctor.  Gently rub and stretch your injured area as often as is comfortable.  Keep all follow-up visits as told by your doctor. This is important. How is this prevented?  Exercise regularly, as told by your doctor.  Warm up and stretch before being active.  Cool down and stretch after being active.  Avoid activities that bother your hip or cause pain.  Avoid sitting down for long periods at a time. Contact a doctor if:  You have a fever.  You get new symptoms.  You have trouble walking.  You have trouble doing everyday  activities.  You have pain that gets worse.  You have pain that does not get better with medicine.  You get red skin on your hip area.  You get a feeling of warmth in your hip area. Get help right away if:  You cannot move your hip.  You have very bad pain. This information is not intended to replace advice given to you by your health care provider. Make sure you discuss any questions you have with your health care provider. Document Released: 09/13/2010 Document Revised: 01/17/2016 Document Reviewed: 03/13/2015 Elsevier Interactive Patient Education  [employer].

## 2017-04-14 NOTE — Progress Notes (Signed)
Lindsay George is a 46 y.o. female who presents to [facility] today for left leg pain.  Patient notes a several week history of left lateral leg pain. Pain is located in the lateral hip are worse with standing from a seated position and lying on her left side. She notes that she has been limping because of the pain. She denies any injury. She notes the pain can be quite severe time. She's tried some over-the-counter medicines which have helped. She has a history of pars defect in her lumbar spine. She notes this pain is not similar. She denies any weakness into her lower leg. No bowel or bladder dysfunction.  Hypertension: Patient also has hypertension. She did not take her medications this morning. Typically she notes her blood pressure is well-controlled. No chest pain or palpitations or shortness of breath.   Past Medical History:  Diagnosis Date  . History of hysterectomy   . Hypertension    Past Surgical History:  Procedure Laterality Date  . TAH RSO 2012 for uterine fibroids     Social History  Substance Use Topics  . Smoking status: Never Smoker  . Smokeless tobacco: Not on file  . Alcohol use 0.0 oz/week     ROS:  As above   Medications: Current Outpatient Prescriptions  Medication Sig Dispense Refill  . amLODipine (NORVASC) 10 MG tablet TAKE 1 TABLET BY MOUTH ONCE DAILY . 30 tablet 1  . cyclobenzaprine (FLEXERIL) 5 MG tablet Take 1 tablet (5 mg total) by mouth 3 (three) times daily as needed for muscle spasms. 90 tablet 1  . hydrochlorothiazide (HYDRODIURIL) 25 MG tablet Take 1 tablet (25 mg total) by mouth daily. 30 tablet 1  . ipratropium (ATROVENT) 0.06 % nasal spray Place 2 sprays into both nostrils every 4 (four) hours as needed for rhinitis. 10 mL 6  . traMADol (ULTRAM) 50 MG tablet Take 1 tablet (50 mg total) by mouth every 8 (eight) hours as needed. 30 tablet 0  . traZODone (DESYREL) 50 MG tablet Take 0.5-1 tablets  (25-50 mg total) by mouth at bedtime as needed for sleep. 30 tablet 3  . triamcinolone ointment (KENALOG) 0.5 % Apply 1 application topically 2 (two) times daily. To affected area, avoid eyes and face 30 g 3   No current facility-administered medications for this visit.    Allergies  Allergen Reactions  . Ace Inhibitors Cough    Cough     Exam:  BP (!) 156/92   Pulse 83   Wt 149 lb (67.6 kg)   BMI 25.58 kg/m  General: Well Developed, well nourished, and in no acute distress.  Neuro/Psych: Alert and oriented x3, extra-ocular muscles intact, able to move all 4 extremities, sensation grossly intact. Skin: Warm and dry, no rashes noted.  Respiratory: Not using accessory muscles, speaking in full sentences, trachea midline.  Cardiovascular: Pulses palpable, no extremity edema. Abdomen: Does not appear distended. MSK:  L-spine nontender to spinal midline. Left hip normal motion. Tender palpation greater trochanter. Weak and painful 4/5 to resisted hip abduction. Antalgic gait present.  Hip greater trochanteric injection: Left Consent obtained and timeout performed. Area of maximum tenderness palpated and identified. Skin cleaned with alcohol, cold spray applied. A Spinal needle was used to access the greater trochanteric bursa. 80 mg of Depo-Medrol, and 4 mL of Marcaine were used to inject the trochanteric bursa. Patient tolerated the procedure well.   Study Result   CLINICAL DATA:  Left-sided low back  pain radiating to buttocks and left leg. No history of trauma.  EXAM: LUMBAR SPINE - COMPLETE 4+ VIEW  COMPARISON:  None.  FINDINGS: AP, lateral, and oblique views of the lumbar spine are provided. There are bilateral chronic pars interarticularis defects at the L4 level somewhat obscured by callus formation and osseous spurring. There is associated mild (grade 1) anterolisthesis of L4 on L5. Alignment is otherwise normal.  No acute-appearing fracture line or  displaced fracture fragment. Facet joints remain well aligned throughout. Paravertebral soft tissues are unremarkable. Sacrum appears intact and well aligned.  IMPRESSION: Bilateral chronic-appearing pars interarticularis defects at the L4 level which are somewhat obscured by callus formation and osseous spurring. 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  No acute findings.   Electronically Signed   By: Franki Cabot M.D.   On: 04/19/2015 13:04       No results found for this or any previous visit (from the past 48 hour(s)). No results found.    Assessment and Plan: 46 y.o. female with left lateral hip pain is greater trochanteric bursitis or abductor tendinopathy. Plan for injection today as listed above as well as referral to physical therapy home exercise program and relative rest. Will recheck back in about 6 weeks.  Additionally patient is currently being managed for hypertension. Plan to check fasting labs are ordered recently as well as temporarily refill amlodipine and hydrochlorothiazide. If labs are well we'll refill both of these medicines with 90 day supply. We'll recheck blood pressure when patient returns to clinic in about 6 weeks.    Orders Placed This Encounter  Procedures  . Ambulatory referral to Physical Therapy    Referral Priority:   Routine    Referral Type:   Physical Medicine    Referral Reason:   Specialty Services Required    Requested Specialty:   Physical Therapy   Meds ordered this encounter  Medications  . amLODipine (NORVASC) 10 MG tablet    Sig: TAKE 1 TABLET BY MOUTH ONCE DAILY .    Dispense:  30 tablet    Refill:  1    Please consider 90 day supplies to promote better adherence  . hydrochlorothiazide (HYDRODIURIL) 25 MG tablet    Sig: Take 1 tablet (25 mg total) by mouth daily.    Dispense:  30 tablet    Refill:  1    Discussed warning signs or symptoms. Please see discharge instructions. Patient expresses  understanding.

## 2017-04-29 ENCOUNTER — Encounter: Payer: Self-pay | Admitting: Family Medicine

## 2017-04-29 ENCOUNTER — Other Ambulatory Visit: Payer: Self-pay | Admitting: Family Medicine

## 2017-04-29 ENCOUNTER — Ambulatory Visit (INDEPENDENT_AMBULATORY_CARE_PROVIDER_SITE_OTHER): Payer: BLUE CROSS/BLUE SHIELD

## 2017-04-29 ENCOUNTER — Ambulatory Visit (INDEPENDENT_AMBULATORY_CARE_PROVIDER_SITE_OTHER): Payer: BLUE CROSS/BLUE SHIELD | Admitting: Family Medicine

## 2017-04-29 VITALS — BP 138/83 | HR 83 | Temp 97.9°F | Ht 64.0 in | Wt 152.5 lb

## 2017-04-29 DIAGNOSIS — M7062 Trochanteric bursitis, left hip: Secondary | ICD-10-CM

## 2017-04-29 DIAGNOSIS — M545 Low back pain, unspecified: Secondary | ICD-10-CM

## 2017-04-29 DIAGNOSIS — G8929 Other chronic pain: Secondary | ICD-10-CM

## 2017-04-29 DIAGNOSIS — M4306 Spondylolysis, lumbar region: Secondary | ICD-10-CM

## 2017-04-29 DIAGNOSIS — M25552 Pain in left hip: Secondary | ICD-10-CM

## 2017-04-29 MED ORDER — PREDNISONE 5 MG (48) PO TBPK: ORAL_TABLET | ORAL | 0 refills | Status: DC

## 2017-04-29 MED ORDER — TRAMADOL HCL 50 MG PO TABS: 50.0000 mg | ORAL_TABLET | Freq: Three times a day (TID) | ORAL | 0 refills | Status: DC | PRN

## 2017-04-29 MED ORDER — AMLODIPINE BESYLATE 10 MG PO TABS: ORAL_TABLET | ORAL | 1 refills | Status: DC

## 2017-04-29 NOTE — Patient Instructions (Signed)
Thank you for coming in today. Attend PT.  Take prednisone daily for 12 days as directed.  USe tramadol sparingly.  Recheck in 4 weeks.  Return sooner if needed.    Come back or go to the emergency room if you notice new weakness new numbness problems walking or bowel or bladder problems.

## 2017-04-29 NOTE — Progress Notes (Signed)
Lindsay George is a 46 y.o. female who presents to [facility]: [place] today for left lateral hip pain. Patient returns to clinic today complaining of significant left lateral hip pain. She was seen on August 21 and thought to have greater trochanteric bursitis. She had a greater trochanteric injection which did not help. She's been unable to do most home exercises. She has not yet had physical therapy. She notes the pain is located in the lateral hip and radiates to the lateral upper leg. She does not have much pain radiating below the level of the knee. She also has an L4-L5 pars defect that is likely chronic based on previous imaging.   Past Medical History:  Diagnosis Date  . History of hysterectomy   . Hypertension    Past Surgical History:  Procedure Laterality Date  . TAH RSO 2012 for uterine fibroids     Social History  Substance Use Topics  . Smoking status: Never Smoker  . Smokeless tobacco: Never Used  . Alcohol use 0.0 oz/week   family history includes Breast cancer (age of onset: 26) in her mother; Hypertension in her brother, daughter, father, maternal aunt, maternal grandfather, maternal grandmother, maternal uncle, mother, paternal aunt, paternal grandfather, paternal grandmother, paternal uncle, sister, and son.  ROS as above:  Medications: Current Outpatient Prescriptions  Medication Sig Dispense Refill  . amLODipine (NORVASC) 10 MG tablet TAKE 1 TABLET BY MOUTH ONCE DAILY . 90 tablet 1  . cyclobenzaprine (FLEXERIL) 5 MG tablet Take 1 tablet (5 mg total) by mouth 3 (three) times daily as needed for muscle spasms. 90 tablet 1  . hydrochlorothiazide (HYDRODIURIL) 25 MG tablet Take 1 tablet (25 mg total) by mouth daily. 30 tablet 1  . ipratropium (ATROVENT) 0.06 % nasal spray Place 2 sprays into both nostrils every 4 (four) hours as needed for rhinitis. 10 mL  6  . traMADol (ULTRAM) 50 MG tablet Take 1 tablet (50 mg total) by mouth every 8 (eight) hours as needed. 15 tablet 0  . traZODone (DESYREL) 50 MG tablet Take 0.5-1 tablets (25-50 mg total) by mouth at bedtime as needed for sleep. 30 tablet 3  . triamcinolone ointment (KENALOG) 0.5 % Apply 1 application topically 2 (two) times daily. To affected area, avoid eyes and face 30 g 3  . predniSONE (STERAPRED UNI-PAK 48 TAB) 5 MG (48) TBPK tablet 12 day dosepack po 48 tablet 0   No current facility-administered medications for this visit.    Allergies  Allergen Reactions  . Ace Inhibitors Cough    Cough    Health Maintenance Health Maintenance  Topic Date Due  . HIV Screening  12/09/1983  . TETANUS/TDAP  05/15/2017 (Originally 12/09/1987)  . INFLUENZA VACCINE  04/14/2018 (Originally 03/25/2017)     Exam:  BP 138/83   Pulse 83   Temp 97.9 F (36.6 C)   Ht 5\' 4"  (1.626 m)   Wt 152 lb 8 oz (69.2 kg)   SpO2 98%   BMI 26.18 kg/m  Gen: Well NAD HEENT: EOMI,  MMM Lungs: Normal work of breathing. CTABL Heart: RRR no MRG Abd: NABS, Soft. Nondistended, Nontender Exts: Brisk capillary refill, warm and well perfused.  Left hip normal-appearing pain with motion tender palpation greater trochanter antalgic gait. Hip abduction strength is diminished.  No results found for this or any previous visit (from the past 72 hour(s)). Dg Hip Unilat With Pelvis 2-3 Views Left  Result Date: 04/29/2017  CLINICAL DATA:  LEFT hip pain for 1 year. Pain progressively worse at, status post cortisone injection 1 week ago. EXAM: DG HIP (WITH OR WITHOUT PELVIS) 2-3V LEFT COMPARISON:  None. FINDINGS: There is no evidence of hip fracture or dislocation. There is no evidence of arthropathy or other focal bone abnormality. IMPRESSION: Negative. Electronically Signed   By: Elon Alas M.D.   On: 04/29/2017 15:48      Assessment and Plan: 46 y.o. female with left lateral hip pain is very likely trochanteric  bursitis versus hip abductor tendinopathy. At this point I think physical therapy really is essential. Referral placed. In the meantime we'll use a prednisone Dosepak for inflammation control and will use tramadol for pain control.  Patient researched [facility] Controlled Substance Reporting System.    Orders Placed This Encounter  Procedures  . Ambulatory referral to Physical Therapy    Referral Priority:   Routine    Referral Type:   Physical Medicine    Referral Reason:   Specialty Services Required    Requested Specialty:   Physical Therapy   Meds ordered this encounter  Medications  . predniSONE (STERAPRED UNI-PAK 48 TAB) 5 MG (48) TBPK tablet    Sig: 12 day dosepack po    Dispense:  48 tablet    Refill:  0  . traMADol (ULTRAM) 50 MG tablet    Sig: Take 1 tablet (50 mg total) by mouth every 8 (eight) hours as needed.    Dispense:  15 tablet    Refill:  0  . amLODipine (NORVASC) 10 MG tablet    Sig: TAKE 1 TABLET BY MOUTH ONCE DAILY .    Dispense:  90 tablet    Refill:  1     Discussed warning signs or symptoms. Please see discharge instructions. Patient expresses understanding.

## 2017-05-06 ENCOUNTER — Ambulatory Visit (INDEPENDENT_AMBULATORY_CARE_PROVIDER_SITE_OTHER): Payer: BLUE CROSS/BLUE SHIELD | Admitting: Rehabilitative and Restorative Service Providers"

## 2017-05-06 DIAGNOSIS — R2689 Other abnormalities of gait and mobility: Secondary | ICD-10-CM

## 2017-05-06 DIAGNOSIS — M25552 Pain in left hip: Secondary | ICD-10-CM

## 2017-05-06 DIAGNOSIS — R29898 Other symptoms and signs involving the musculoskeletal system: Secondary | ICD-10-CM

## 2017-05-06 NOTE — Patient Instructions (Addendum)
Ball massage work   Abdominal Bracing With Pelvic Floor (Hook-Lying)    With neutral spine, tighten pelvic floor and abdominals sucking belly button to back bone; tighten muscles in back at wiast. Hold 10 sec  Repeat _10 times. Do __several_ times a day. Progress to do this sitting; standing and walking   Trunk: Prone Extension (Press-Ups)    Lie on stomach on firm, flat surface. Relax bottom and legs. Raise chest in air with elbows straight. Keep hips flat on surface, sag stomach. Hold _2-3___ seconds. Repeat _10___ times. Do __2-3_ sessions per day. CAUTION: Movement should be gentle and slow.  KNEE: Quadriceps - Prone    Place strap around ankle. Bring ankle toward buttocks. Press hip into surface. Hold _30__ seconds. __3_ reps per set, _2-3__ sets per day  Piriformis Stretch    Lying on back with both knees bent.  Cross Left ankle on Right thigh.  pull knee toward opposite shoulder. Hold _30___ seconds. Repeat __3__ times. Do __2__ sessions per day.  TENS UNIT: This is helpful for muscle pain and spasm.   Search and Purchase a TENS 7000 2nd edition at www.tenspros.com.    OR on [email or web address] (about $25) It should be less than $30.     TENS unit instructions: Do not shower or bathe with the unit on Turn the unit off before removing electrodes or batteries If the electrodes lose stickiness add a drop of water to the electrodes after they are disconnected from the unit and place on plastic sheet. If you continued to have difficulty, call the TENS unit company to purchase more electrodes. Do not apply lotion on the skin area prior to use. Make sure the skin is clean and dry as this will help prolong the life of the electrodes. After use, always check skin for unusual red areas, rash or other skin difficulties. If there are any skin problems, does not apply electrodes to the same area. Never remove the electrodes from the unit by pulling the wires. Do not use the TENS unit  or electrodes other than as directed. Do not change electrode placement without consultating your therapist or physician. Keep 2 fingers with between each electrode. Wear time ratio is 2:1, on to off times.    For example on for 30 minutes off for 15 minutes and then on for 30 minutes off for 15 minutes   [facility] [place] [address] [place], [place] [address]  [phone] (office) [phone] (fax)

## 2017-05-06 NOTE — Therapy (Addendum)
[facility] [place], [place], [address] Phone: [phone]   Fax:  [phone]  Physical Therapy Evaluation  Patient Details  Name: Lindsay George MRN: [id number] Date of Birth: [birth year] Referring Provider: Dr Lynne Leader   Encounter Date: 05/06/2017      PT End of Session - 05/06/17 1149    Visit Number 1   Number of Visits 12   Date for PT Re-Evaluation 06/17/17   PT Start Time 1103   PT Stop Time 1208   PT Time Calculation (min) 65 min   Activity Tolerance Patient tolerated treatment well      Past Medical History:  Diagnosis Date  . History of hysterectomy   . Hypertension     Past Surgical History:  Procedure Laterality Date  . TAH RSO 2012 for uterine fibroids      There were no vitals filed for this visit.       Subjective Assessment - 05/06/17 1110    Subjective Naome reports that she has had Rt hip pain on and off for the past 2-3 years with last flare up over the past month. She has incresaed symptoms and was seen in PT last year for a couple of visits. Symptoms improved but have started again. She has intense pain in the Lt LB and hip down into the Lt thigh and knee areas.    Pertinent History HTN; history of Lt hip pain for the past 3 yrs with no known injury    How long can you sit comfortably? not at all   How long can you stand comfortably? not at all   How long can you walk comfortably? not at all    Diagnostic tests xrays    Patient Stated Goals release the pain and get more active    Currently in Pain? Yes   Pain Score 7    Pain Location Hip   Pain Orientation Left   Pain Descriptors / Indicators Burning;Nagging   Pain Type Chronic pain   Pain Radiating Towards up into the Lt LB and into the Lt buttocks to thigh to knee   Pain Onset More than a month ago   Pain Frequency Constant   Aggravating Factors  lifting; holding objects in hands/arms; sitting;    Pain Relieving  Factors meds; lying in the bed             [facility] PT Assessment - 05/06/17 0001      Assessment   Medical Diagnosis Lt trochanteric bursitis   Referring Provider Dr Lynne Leader    Onset Date/Surgical Date 03/25/17  pain on an intermittent basis for the past 3 years    Hand Dominance Right   Next MD Visit 10/18   Prior Therapy 1 visit here for same problem ~ 2 yrs ago      Precautions   Precautions None     Balance Screen   Has the patient fallen in the past 6 months No   Has the patient had a decrease in activity level because of a fear of falling?  No   Is the patient reluctant to leave their home because of a fear of falling?  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; 5 daysa week - does have variable height desk but does not stand at work    Leisure household chores; cooking -  sedentary      Observation/Other Assessments   Focus on Therapeutic Outcomes (FOTO)  98% limitation      Sensation   Additional Comments numbness in the Lt anle and foot sock like pattern - intermittent      Posture/Postural Control   Posture Comments sits with wt shifted to the Rt, Lt le extended in front of body - standing with trunk fwd flexed, wt shifted to the Rt, flexion of Lt hip and knee; Lt LE in ER       AROM   Right/Left Hip --  end range tightness Lt hip especially hip ext and rotation    Lumbar Flexion 65%   Lumbar Extension 40%  75% with prone press up   Lumbar - Right Side Bend 75%  pulling lateral Lt hip and thigh    Lumbar - Left Side Bend 75%   Lumbar - Right Rotation 50%   Lumbar - Left Rotation 50%     Strength   Overall Strength Comments Rt LE 5/5; Lt 4+/5 and painful with resistive testing hip flex/ext/abd      Flexibility   Hamstrings Rt 75 deg; Lt 65 deg   Quadriceps tight Lt > Rt   ITB tight Lt > Rt    Piriformis tight Lt > Rt      Palpation   Spinal  mobility hypomobile lumbar CPA glides    Palpation comment significant muscular tightness noted through the Lt hip flexors; QL; lumbar paraspinals; piriformis; gluts; quads; ITB     Ambulation/Gait   Gait Comments abnormal gait pattern with decresed wt shift to the Lt in stance phase on Lt; forward flexed postrue; flexion through the Lt hip with ER of Lt hip throughout gait             Objective measurements completed on examination: See above findings.          [facility] PT Treatment/Exercise - 05/06/17 0001      Lumbar Exercises: Stretches   Press Ups --  10 reps, 2 sec pause   Quad Stretch 3 reps;30 seconds   Piriformis Stretch 3 reps;30 seconds     Lumbar Exercises: Supine   Ab Set 5 reps  10 sec hold, tactile cues for technique   AB Set Limitations educated pt to use this during transitional movements.      Modalities   Modalities Electrical Stimulation;Moist Heat     Moist Heat Therapy   Number Minutes Moist Heat 20 Minutes   Moist Heat Location Lumbar Spine;Hip  Lt hip     Electrical Stimulation   Electrical Stimulation Location Lt hip / low back   Electrical Stimulation Action IFC   Electrical Stimulation Parameters to tolerance    Electrical Stimulation Goals Pain                PT Education - 05/06/17 1151    Education provided Yes   Education Details HEP; TENS info   Person(s) Educated Patient   Methods Explanation;Handout;Demonstration;Tactile cues;Verbal cues   Comprehension Verbalized understanding             PT Long Term Goals - 05/06/17 1206      PT LONG TERM GOAL #1   Title I in exercise for home 06/17/17   Time 6   Period Weeks   Status New     PT LONG TERM GOAL #2   Title Improve trunk and LE mobility to [facility] 06/17/17   Time 6   Period  Weeks   Status New     PT LONG TERM GOAL #3   Title Decresae pain to 2/10 to 4/10 allowing patient to improve tolerance for functional positions in sitting, standing and  walking 06/17/17   Time 6   Period Weeks   Status New     PT LONG TERM GOAL #4   Title Improve gait pattern with patient to demonstrate improved weight shift and swing phase Lt LE 06/17/17   Time 6   Period Weeks   Status New     PT LONG TERM GOAL #5   Title Improve FOTO to </= 58% limitation 06/17/17   Time 6   Period Weeks   Status New                Plan - 05/06/17 1202    Clinical Impression Statement Patient presents with chronic recurrent Lt lower quarter pain and dysfunction. Symtpoms have progressively worsened over the past three years with no known cause of injury. She has poor posture and alignment; abnormal gait pattern; limited trunk and LE mobilty; Lt LE weakness; significant muscular tightness to palpation through Lt lumbar and hip areas; pain and decresed functional abilities. She will benefit form PT to address problems identified.    Clinical Presentation Evolving   Clinical Decision Making Low   Rehab Potential Good   PT Frequency 2x / week   PT Duration 6 weeks   PT Treatment/Interventions Patient/family education;ADLs/Self Care Home Management;Cryotherapy;Electrical Stimulation;Iontophoresis 83m/ml Dexamethasone;Moist Heat;Ultrasound;Dry needling;Manual techniques;Therapeutic activities;Therapeutic exercise;Neuromuscular re-education   PT Next Visit Plan review HEP; add myofacial release work; manual work through [employer] quarter; education re myofacial pain and rehab; modalities; discuss DN as indicated    Consulted and Agree with Plan of Care Patient      Patient will benefit from skilled therapeutic intervention in order to improve the following deficits and impairments:  Postural dysfunction, Improper body mechanics, Pain, Increased fascial restricitons, Increased muscle spasms, Decreased range of motion, Decreased strength, Decreased mobility, Decreased activity tolerance  Visit Diagnosis: Pain in left hip - Plan: PT plan of care  cert/re-cert  Other symptoms and signs involving the musculoskeletal system - Plan: PT plan of care cert/re-cert  Other abnormalities of gait and mobility - Plan: PT plan of care cert/re-cert     Problem List Patient Active Problem List   Diagnosis Date Noted  . Insomnia 08/15/2016  . Abnormal mammogram of left breast 06/24/2016  . 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 Bursitis of left hip 05/11/2015  . 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 HTN (hypertension) 04/19/2015  . Lumbago 04/19/2015  . History of hysterectomy 04/19/2015  . Breast cancer screening 04/19/2015    Celyn PNilda SimmerPT, MPH  05/06/2017, 12:18 PM  [facility] [address] [address]Phone: [phone]  Fax:  [phone] Name: TEmilina SmarrMRN: [id number] of Birth: [birth year] PHYSICAL THERAPY DISCHARGE SUMMARY  Visits from Start of Care: Eval only  Current functional level related to goals / functional outcomes: Unknown   Remaining deficits: Unknown    Education / Equipment: Initial HEP Plan: Patient agrees to discharge.  Patient goals were not met. Patient is being discharged due to not returning since the last visit.  ?????    Celyn P. HHelene KelpPT, MPH 06/22/17 10:16 AM

## 2017-08-04 ENCOUNTER — Other Ambulatory Visit: Payer: Self-pay

## 2017-08-04 DIAGNOSIS — I10 Essential (primary) hypertension: Secondary | ICD-10-CM

## 2017-08-05 LAB — COMPLETE METABOLIC PANEL WITH GFR
AG Ratio: 1.7 (calc) (ref 1.0–2.5)
ALBUMIN MSPROF: 4.3 g/dL (ref 3.6–5.1)
ALT: 11 U/L (ref 6–29)
AST: 12 U/L (ref 10–35)
Alkaline phosphatase (APISO): 67 U/L (ref 33–115)
BUN: 12 mg/dL (ref 7–25)
CALCIUM: 9.4 mg/dL (ref 8.6–10.2)
CO2: 33 mmol/L — AB (ref 20–32)
CREATININE: 0.83 mg/dL (ref 0.50–1.10)
Chloride: 103 mmol/L (ref 98–110)
GFR, EST AFRICAN AMERICAN: 97 mL/min/{1.73_m2} (ref >=60)
GFR, EST NON AFRICAN AMERICAN: 83 mL/min/{1.73_m2} (ref >=60)
GLUCOSE: 98 mg/dL (ref 65–99)
Globulin: 2.6 g/dL (calc) (ref 1.9–3.7)
Potassium: 3.4 mmol/L — ABNORMAL LOW (ref 3.5–5.3)
Sodium: 141 mmol/L (ref 135–146)
TOTAL PROTEIN: 6.9 g/dL (ref 6.1–8.1)
Total Bilirubin: 0.4 mg/dL (ref 0.2–1.2)

## 2017-08-05 LAB — CBC
HEMATOCRIT: 39 % (ref 35.0–45.0)
Hemoglobin: 13.8 g/dL (ref 11.7–15.5)
MCH: 30.7 pg (ref 27.0–33.0)
MCHC: 35.4 g/dL (ref 32.0–36.0)
MCV: 86.7 fL (ref 80.0–100.0)
MPV: 9.9 fL (ref 7.5–12.5)
PLATELETS: 403 10*3/uL — AB (ref 140–400)
RBC: 4.5 10*6/uL (ref 3.80–5.10)
RDW: 15.6 % — ABNORMAL HIGH (ref 11.0–15.0)
WBC: 7.4 10*3/uL (ref 3.8–10.8)

## 2017-08-05 LAB — LIPID PANEL W/REFLEX DIRECT LDL
Cholesterol: 231 mg/dL — ABNORMAL HIGH (ref <200)
HDL: 70 mg/dL (ref >50)
LDL CHOLESTEROL (CALC): 138 mg/dL — AB
NON-HDL CHOLESTEROL (CALC): 161 mg/dL — AB (ref <130)
TRIGLYCERIDES: 114 mg/dL (ref <150)
Total CHOL/HDL Ratio: 3.3 (calc) (ref <5.0)

## 2017-08-06 ENCOUNTER — Ambulatory Visit (INDEPENDENT_AMBULATORY_CARE_PROVIDER_SITE_OTHER): Payer: BLUE CROSS/BLUE SHIELD | Admitting: Family Medicine

## 2017-08-06 ENCOUNTER — Encounter: Payer: Self-pay | Admitting: Family Medicine

## 2017-08-06 ENCOUNTER — Ambulatory Visit (INDEPENDENT_AMBULATORY_CARE_PROVIDER_SITE_OTHER): Payer: BLUE CROSS/BLUE SHIELD

## 2017-08-06 VITALS — BP 148/96 | HR 80 | Ht 63.0 in | Wt 155.0 lb

## 2017-08-06 DIAGNOSIS — M545 Low back pain, unspecified: Secondary | ICD-10-CM

## 2017-08-06 DIAGNOSIS — I10 Essential (primary) hypertension: Secondary | ICD-10-CM

## 2017-08-06 DIAGNOSIS — J45991 Cough variant asthma: Secondary | ICD-10-CM

## 2017-08-06 DIAGNOSIS — M4306 Spondylolysis, lumbar region: Secondary | ICD-10-CM

## 2017-08-06 DIAGNOSIS — R05 Cough: Secondary | ICD-10-CM

## 2017-08-06 MED ORDER — IRBESARTAN-HYDROCHLOROTHIAZIDE 150-12.5 MG PO TABS: 1.0000 | ORAL_TABLET | Freq: Every day | ORAL | 0 refills | Status: DC

## 2017-08-06 MED ORDER — BENZONATATE 200 MG PO CAPS: 200.0000 mg | ORAL_CAPSULE | Freq: Three times a day (TID) | ORAL | 3 refills | Status: DC | PRN

## 2017-08-06 MED ORDER — ALBUTEROL SULFATE HFA 108 (90 BASE) MCG/ACT IN AERS: 2.0000 | INHALATION_SPRAY | Freq: Four times a day (QID) | RESPIRATORY_TRACT | 0 refills | Status: DC | PRN

## 2017-08-06 MED ORDER — FLUTICASONE FUROATE-VILANTEROL 100-25 MCG/INH IN AEPB: 1.0000 | INHALATION_SPRAY | Freq: Every day | RESPIRATORY_TRACT | 11 refills | Status: DC

## 2017-08-06 NOTE — Progress Notes (Signed)
Lindsay George is a 46 y.o. female who presents to [place]: Lindsay George today for HTN, HLD and discuss cough.   HTN: Lindsay George has a diagnosis of hypertension and currently takes hydrochlorothiazide 25 and amlodipine 10 daily.  She does well with this medication and denies chest pain palpitations or shortness of breath.  She does not check her blood pressure regularly.  Hyperlipidemia: Lindsay George recently had fasting labs.  She does not take anything for lipids.  She tries to eat a careful diet.  Chronic cough: Lindsay George notes a chronic mildly productive cough worse in the mornings worse with speaking associated occasionally with wheezing.  She notes that she had a diagnosis of asthma earlier in her life.  She denies shortness of breath.  She does not smoke.  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  This causes intermittent back pain.  She is here also with FMLA paperwork for intermittently if for flareups of back pain.  Most of the time she does well.   Past Medical History:  Diagnosis Date  . History of hysterectomy   . Hypertension    Past Surgical History:  Procedure Laterality Date  . TAH RSO 2012 for uterine fibroids     Social History   Tobacco Use  . Smoking status: Never Smoker  . Smokeless tobacco: Never Used  Substance Use Topics  . Alcohol use: Yes    Alcohol/week: 0.0 oz   family history includes Breast cancer (age of onset: 49) in her mother; Hypertension in her brother, daughter, father, maternal aunt, maternal grandfather, maternal grandmother, maternal uncle, mother, paternal aunt, paternal grandfather, paternal grandmother, paternal uncle, sister, and son.  ROS as above:  Medications: Current Outpatient Medications  Medication Sig Dispense Refill  . amLODipine (NORVASC) 10 MG tablet TAKE 1 TABLET BY MOUTH ONCE DAILY . 90 tablet 1  .  cyclobenzaprine (FLEXERIL) 5 MG tablet Take 1 tablet (5 mg total) by mouth 3 (three) times daily as needed for muscle spasms. 90 tablet 1  . ipratropium (ATROVENT) 0.06 % nasal spray Place 2 sprays into both nostrils every 4 (four) hours as needed for rhinitis. 10 mL 6  . traMADol (ULTRAM) 50 MG tablet Take 1 tablet (50 mg total) by mouth every 8 (eight) hours as needed. 15 tablet 0  . traZODone (DESYREL) 50 MG tablet Take 0.5-1 tablets (25-50 mg total) by mouth at bedtime as needed for sleep. 30 tablet 3  . triamcinolone ointment (KENALOG) 0.5 % Apply 1 application topically 2 (two) times daily. To affected area, avoid eyes and face 30 g 3  . albuterol (PROVENTIL HFA;VENTOLIN HFA) 108 (90 Base) MCG/ACT inhaler Inhale 2 puffs into the lungs every 6 (six) hours as needed for wheezing or shortness of breath. 1 Inhaler 0  . benzonatate (TESSALON) 200 MG capsule Take 1 capsule (200 mg total) by mouth 3 (three) times daily as needed for cough. 90 capsule 3  . fluticasone furoate-vilanterol (BREO ELLIPTA) 100-25 MCG/INH AEPB Inhale 1 puff into the lungs daily. 1 each 11  . irbesartan-hydrochlorothiazide (AVALIDE) 150-12.5 MG tablet Take 1 tablet by mouth daily. 90 tablet 0   No current facility-administered medications for this visit.    Allergies  Allergen Reactions  . Ace Inhibitors Cough    Cough    Health Maintenance Health Maintenance  Topic Date Due  . HIV Screening  12/09/1983  . TETANUS/TDAP  12/09/1987  . INFLUENZA VACCINE  04/14/2018 (  Originally 03/25/2017)     Exam:  BP (!) 148/96   Pulse 80   Ht 5\' 3"  (1.6 m)   Wt 155 lb (70.3 kg)   BMI 27.46 kg/m  Gen: Well NAD HEENT: EOMI,  MMM Lungs: Normal work of breathing. CTABL Heart: RRR no MRG Abd: NABS, Soft. Nondistended, Nontender Exts: Brisk capillary refill, warm and well perfused.    Results for orders placed or performed in visit on 08/04/17 (from the past 72 hour(s))  COMPLETE METABOLIC PANEL WITH GFR     Status:  Abnormal   Collection Time: 08/04/17 10:38 AM  Result Value Ref Range   Glucose, Bld 98 65 - 99 mg/dL    Comment: .            Fasting reference interval .    BUN 12 7 - 25 mg/dL   Creat 0.83 0.50 - 1.10 mg/dL   GFR, Est Non African American 83 > OR = 60 mL/min/1.28m2   GFR, Est African American 97 > OR = 60 mL/min/1.81m2   BUN/Creatinine Ratio NOT APPLICABLE 6 - 22 (calc)   Sodium 141 135 - 146 mmol/L   Potassium 3.4 (L) 3.5 - 5.3 mmol/L   Chloride 103 98 - 110 mmol/L   CO2 33 (H) 20 - 32 mmol/L   Calcium 9.4 8.6 - 10.2 mg/dL   Total Protein 6.9 6.1 - 8.1 g/dL   Albumin 4.3 3.6 - 5.1 g/dL   Globulin 2.6 1.9 - 3.7 g/dL (calc)   AG Ratio 1.7 1.0 - 2.5 (calc)   Total Bilirubin 0.4 0.2 - 1.2 mg/dL   Alkaline phosphatase (APISO) 67 33 - 115 U/L   AST 12 10 - 35 U/L   ALT 11 6 - 29 U/L  CBC     Status: Abnormal   Collection Time: 08/04/17 10:38 AM  Result Value Ref Range   WBC 7.4 3.8 - 10.8 Thousand/uL   RBC 4.50 3.80 - 5.10 Million/uL   Hemoglobin 13.8 11.7 - 15.5 g/dL   HCT 39.0 35.0 - 45.0 %   MCV 86.7 80.0 - 100.0 fL   MCH 30.7 27.0 - 33.0 pg   MCHC 35.4 32.0 - 36.0 g/dL   RDW 15.6 (H) 11.0 - 15.0 %   Platelets 403 (H) 140 - 400 Thousand/uL   MPV 9.9 7.5 - 12.5 fL  Lipid Panel w/reflex Direct LDL     Status: Abnormal   Collection Time: 08/04/17 10:38 AM  Result Value Ref Range   Cholesterol 231 (H) <200 mg/dL   HDL 70 >50 mg/dL   Triglycerides 114 <150 mg/dL   LDL Cholesterol (Calc) 138 (H) mg/dL (calc)    Comment: Reference range: <100 . Desirable range <100 mg/dL for primary prevention;   <70 mg/dL for patients with CHD or diabetic patients  with > or = 2 CHD risk factors. Marland Kitchen LDL-C is now calculated using the Martin-Hopkins  calculation, which is a validated novel method providing  better accuracy than the Friedewald equation in the  estimation of LDL-C.  Cresenciano Genre et al. Annamaria Helling. 1478;295(62): 2061-2068  (http://education.[employer].com/faq/FAQ164)     Total CHOL/HDL Ratio 3.3 <5.0 (calc)   Non-HDL Cholesterol (Calc) 161 (H) <130 mg/dL (calc)    Comment: For patients with diabetes plus 1 major ASCVD risk  factor, treating to a non-HDL-C goal of <100 mg/dL  (LDL-C of <70 mg/dL) is considered a therapeutic  option.    No results found.    Assessment and Plan: 46 y.o. female with  Hypertension: Blood pressure not at goal.  Plan to discontinue hydrochlorothiazide and start Avalide.  Continue amlodipine.  Recheck in about a month.  Hyperlipidemia: A little elevated but under threshold for treatment.  10-year heart risk is 4.6%.  Check yearly.  Cough: Unclear etiology.  Concerning for cough variant asthma.  Plan to obtain a chest x-ray and start Brio.  Use albuterol and Tessalon Perles as needed.  Recheck in 1 month  Back pain: Doing well.  FMLA paperwork filled out.   Orders Placed This Encounter  Procedures  . DG Chest 2 View    Order Specific Question:   Reason for exam:    Answer:   Cough, assess intra-thoracic pathology    Order Specific Question:   Is the patient pregnant?    Answer:   No    Order Specific Question:   Preferred imaging location?    Answer:   [facility]   Meds ordered this encounter  Medications  . fluticasone furoate-vilanterol (BREO ELLIPTA) 100-25 MCG/INH AEPB    Sig: Inhale 1 puff into the lungs daily.    Dispense:  1 each    Refill:  11  . irbesartan-hydrochlorothiazide (AVALIDE) 150-12.5 MG tablet    Sig: Take 1 tablet by mouth daily.    Dispense:  90 tablet    Refill:  0    Lindsay George has tried and failed lisinopril, HCTZ and Amlodipine  . albuterol (PROVENTIL HFA;VENTOLIN HFA) 108 (90 Base) MCG/ACT inhaler    Sig: Inhale 2 puffs into the lungs every 6 (six) hours as needed for wheezing or shortness of breath.    Dispense:  1 Inhaler    Refill:  0  . benzonatate (TESSALON) 200 MG capsule    Sig: Take 1 capsule (200 mg total) by mouth 3 (three) times daily as needed for cough.     Dispense:  90 capsule    Refill:  3     Discussed warning signs or symptoms. Please see discharge instructions. Patient expresses understanding.

## 2017-08-06 NOTE — Patient Instructions (Addendum)
Thank you for coming in today. STOP HCTZ.  Start Avalide.  Recheck in 1-2 months.  Start Breo daily to help with cough and congestion.  Use albuterol as needed for cough Use tessalon as needed for cough  Hydrochlorothiazide, HCTZ; Irbesartan tablets What is this medicine? HYDROCHLOROTHIAZIDE; IRBESARTAN (hye dro klor oh THYE a zide; ir be SAR tan) is a combination of a drug that is a diuetic and one that relaxes blood vessels. It is used to treat high blood pressure. This medicine may be used for other purposes; ask your health care provider or pharmacist if you have questions. COMMON BRAND NAME(S): Avalide What should I tell my health care provider before I take this medicine? They need to know if you have any of these conditions: -decreased urine -if you are on a special diet, like a low salt diet -immune system problems, like lupus -kidney disease -liver disease -an unusual or allergic reaction to irbesartan, hydrochlorothiazide, sulfa drugs, other medicines, foods, dyes, or preservatives -pregnant or trying to get pregnant -breast-feeding How should I use this medicine? Take this medicine by mouth with a glass of water. Follow the directions on the prescription label. You can take it with or without food. If it upsets your stomach, take it with food. Take your medicine at regular intervals. Do not take it more often than directed. Do not stop taking except on your doctor's advice. Talk to your pediatrician regarding the use of this medicine in children. Special care may be needed. Overdosage: If you think you have taken too much of this medicine contact a poison control center or emergency room at once. NOTE: This medicine is only for you. Do not share this medicine with others. What if I miss a dose? If you miss a dose, take it as soon as you can. If it is almost time for your next dose, take only that dose. Do not take double or extra doses. What may interact with this  medicine? -barbiturates like phenobarbital -corticosteroids like prednisone -diabetic medicines -diuretics like triamterene, spironolactone or amiloride -lithium -NSAIDs like ibuprofen -potassium salts or potassium supplements -prescription pain medicines -skeletal muscle relaxants like tubocurarine -some cholesterol lowering medicines like cholestyramine or colestipol This list may not describe all possible interactions. Give your health care provider a list of all the medicines, herbs, non-prescription drugs, or dietary supplements you use. Also tell them if you smoke, drink alcohol, or use illegal drugs. Some items may interact with your medicine. What should I watch for while using this medicine? You must visit your health care professional for regular checks on your progress. Check your blood pressure regularly while you are taking this medicine. Ask your doctor or health care professional what your blood pressure should be and when you should contact him or her. When you check your blood pressure, write down the measurements to show your doctor or health care professional. You must not get dehydrated. Ask your doctor or health care professional how much fluid you need to drink a day. Check with him or her if you get an attack of severe diarrhea, nausea and vomiting, or if you sweat a lot. The loss of too much body fluid can make it dangerous for you to take this medicine. Women should inform their doctor if they wish to become pregnant or think they might be pregnant. There is a potential for serious side effects to an unborn child, particularly in the second or third trimester. Talk to your health care professional or pharmacist  for more information. You may get drowsy or dizzy. Do not drive, use machinery, or do anything that needs mental alertness until you know how this drug affects you. Do not stand or sit up quickly, especially if you are an older patient. This reduces the risk of dizzy  or fainting spells. Alcohol can make you more drowsy and dizzy. Avoid alcoholic drinks. This medicine may affect your blood sugar level. If you have diabetes, check with your doctor or health care professional before changing the dose of your diabetic medicine. Avoid salt substitutes unless you are told otherwise by your doctor or health care professional. This medicine can make you more sensitive to the sun. Keep out of the sun. If you cannot avoid being in the sun, wear protective clothing and use sunscreen. Do not use sun lamps or tanning beds/booths. Do not treat yourself for coughs, colds, or pain while you are taking this medicine without asking your doctor or health care professional for advice. Some ingredients may increase your blood pressure. What side effects may I notice from receiving this medicine? Side effects that you should report to your doctor or health care professional as soon as possible: -allergic reactions like skin rash, itching or hives, swelling of the face, lips, or tongue -breathing problems -changes in vision -dark urine -eye pain -fast or irregular heart beat, palpitations, or chest pain -feeling faint or lightheaded -muscle cramps -persistent dry cough -redness, blistering, peeling or loosening of the skin, including inside the mouth -stomach pain -trouble passing urine or change in the amount of urine -unusual bleeding or bruising -worsened gout pain -yellowing of the eyes or skin Side effects that usually do not require medical attention (report to your doctor or health care professional if they continue or are bothersome): -change in sex drive or performance -headache This list may not describe all possible side effects. Call your doctor for medical advice about side effects. You may report side effects to FDA at 1-800-FDA-1088. Where should I keep my medicine? Keep out of the reach of children. Store at room temperature between 15 and 30 degrees C (59  and 86 degrees F). Throw away any unused medicine after the expiration date. NOTE: This sheet is a summary. It may not cover all possible information. If you have questions about this medicine, talk to your doctor, pharmacist, or health care provider.  2018 Elsevier/Gold Standard (2010-05-01 13:26:49)   Fluticasone; Vilanterol inhalation powder What is this medicine? FLUTICASONE; VILANTEROL (floo TIK a sone; vye LAN ter ol) inhalation is a combination of two medicines that decrease inflammation and help to open up the airways of your lungs. It is for chronic obstructive pulmonary disease (COPD), including chronic bronchitis or emphysema. It is also used for asthma in adults to help control symptoms. Do NOT use for an acute asthma attack or COPD attack. This medicine may be used for other purposes; ask your health care provider or pharmacist if you have questions. COMMON BRAND NAME(S): BREO ELLIPTA What should I tell my health care provider before I take this medicine? They need to know if you have any of these conditions: -bone problems -immune system problems -diabetes -heart disease or irregular heartbeat -high blood pressure -infection -pheochromocytoma -seizures -thyroid disease -an unusual or allergic reaction to fluticasone, vilanterol, milk proteins, corticosteroids, other medicines, foods, dyes, or preservatives -pregnant or trying to get pregnant -breast-feeding How should I use this medicine? This medicine is inhaled through the mouth. It is used once per day. Follow the  directions on the prescription label. Do not use a spacer device with this inhaler. Take your medicine at regular intervals. Do not take your medicine more often than directed. Do not stop taking except on your doctor's advice. Make sure that you are using your inhaler correctly. Ask you doctor or health care provider if you have any questions. A special MedGuide will be given to you by the pharmacist with each  prescription and refill. Be sure to read this information carefully each time. Talk to your pediatrician regarding the use of this medicine in children. Special care may be needed. This medicine is not approved for use in children under 108 years of age. Overdosage: If you think you have taken too much of this medicine contact a poison control center or emergency room at once. NOTE: This medicine is only for you. Do not share this medicine with others. What if I miss a dose? If you miss a dose, use it as soon as you can. If it is almost time for your next dose, use only that dose and continue with your regular schedule. Do not use double or extra doses. What may interact with this medicine? Do not take this medicine with any of the following medications: -cisapride -dofetilide -dronedarone -MAOIs like Carbex, Eldepryl, Marplan, Nardil, and Parnate -pimozide -thioridazine -ziprasidone This medicine may also interact with the following medications: -antiviral medicines for HIV or AIDS -beta-blockers like metoprolol and propranolol -certain medicines for depression, anxiety, or psychotic disturbances -certain medicines for fungal infections like ketoconazole, itraconazole, posaconazole, voriconazole -conivaptan -diuretics -medicines for colds -nefazodone -other medicines for breathing problems -other medicines that prolong the QT interval (cause an abnormal heart rhythm) This list may not describe all possible interactions. Give your health care provider a list of all the medicines, herbs, non-prescription drugs, or dietary supplements you use. Also tell them if you smoke, drink alcohol, or use illegal drugs. Some items may interact with your medicine. What should I watch for while using this medicine? Visit your doctor or health care professional for regular checkups. Tell your doctor or health care professional if your symptoms do not get better. Do not use this medicine more than once  every 24 hours. NEVER use this medicine for an acute asthma or COPD attack. You should use your short-acting rescue inhalers for this purpose. If your symptoms get worse or if you need your short-acting inhalers more often, call your doctor right away. If you are going to have surgery tell your doctor or health care professional that you are using this medicine. Try not to come in contact with people with the chicken pox or measles. If you do, call your doctor. What side effects may I notice from receiving this medicine? Side effects that you should report to your doctor or health care professional as soon as possible: -allergic reactions like skin rash or hives, swelling of the face, lips, or tongue -breathing problems right after inhaling your medicine -changes in vision -chest pain -fast, irregular heartbeat -feeling faint or lightheaded, falls -fever or chills -nausea, vomiting -tiredness Side effects that usually do not require medical attention (report to your doctor or health care professional if they continue or are bothersome): -cough -headache -nervousness -sore throat -tremor This list may not describe all possible side effects. Call your doctor for medical advice about side effects. You may report side effects to FDA at 1-800-FDA-1088. Where should I keep my medicine? Keep out of the reach of children. Store at room temperature between  15 and 30 degrees C (59 and 86 degrees F). Store in a dry place away from direct heat or sunlight. Throw away 6 weeks after you remove the inhaler from the foil tray, or after the dose indicator reads 0, whichever comes first. Throw away any unopened packages after the expiration date. NOTE: This sheet is a summary. It may not cover all possible information. If you have questions about this medicine, talk to your doctor, pharmacist, or health care provider.  2018 Elsevier/Gold Standard (2016-08-14 15:54:39)

## 2017-09-03 ENCOUNTER — Ambulatory Visit (INDEPENDENT_AMBULATORY_CARE_PROVIDER_SITE_OTHER): Payer: BLUE CROSS/BLUE SHIELD | Admitting: Family Medicine

## 2017-09-03 ENCOUNTER — Encounter: Payer: Self-pay | Admitting: Family Medicine

## 2017-09-03 VITALS — BP 161/98 | HR 76 | Ht 63.0 in | Wt 158.0 lb

## 2017-09-03 DIAGNOSIS — M4306 Spondylolysis, lumbar region: Secondary | ICD-10-CM

## 2017-09-03 DIAGNOSIS — I10 Essential (primary) hypertension: Secondary | ICD-10-CM

## 2017-09-03 MED ORDER — IRBESARTAN-HYDROCHLOROTHIAZIDE 150-12.5 MG PO TABS: 2.0000 | ORAL_TABLET | Freq: Every day | ORAL | 0 refills | Status: DC

## 2017-09-03 NOTE — Patient Instructions (Signed)
Thank you for coming in today. Increase the blood pressure medicine to 2 pills daily.  Re-submit the FMLA.  Send me a blood pressure log in the next few weeks.

## 2017-09-03 NOTE — Progress Notes (Signed)
Lindsay George is a 47 y.o. female who presents to [facility] today for hypertension.  Dorothye has a history of hypertension previously was pretty well controlled with Avalide 150/12.5.  He denies chest pain palpitations or shortness of breath.  She notes that when she checks her blood pressure it seems to be more elevated than it should be typically in the 140s 150s.  She denies lightheadedness or dizziness.  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  He notes most the time she does well but it will occasionally cause back pain.  She has FMLA paperwork to fill out for revision.   Past Medical History:  Diagnosis Date  . History of hysterectomy   . Hypertension    Past Surgical History:  Procedure Laterality Date  . TAH RSO 2012 for uterine fibroids     Social History   Tobacco Use  . Smoking status: Never Smoker  . Smokeless tobacco: Never Used  Substance Use Topics  . Alcohol use: Yes    Alcohol/week: 0.0 oz   family history includes Breast cancer (age of onset: 7) in her mother; Hypertension in her brother, daughter, father, maternal aunt, maternal grandfather, maternal grandmother, maternal uncle, mother, paternal aunt, paternal grandfather, paternal grandmother, paternal uncle, sister, and son.  ROS as above:  Medications: Current Outpatient Medications  Medication Sig Dispense Refill  . albuterol (PROVENTIL HFA;VENTOLIN HFA) 108 (90 Base) MCG/ACT inhaler Inhale 2 puffs into the lungs every 6 (six) hours as needed for wheezing or shortness of breath. 1 Inhaler 0  . amLODipine (NORVASC) 10 MG tablet TAKE 1 TABLET BY MOUTH ONCE DAILY . 90 tablet 1  . cyclobenzaprine (FLEXERIL) 5 MG tablet Take 1 tablet (5 mg total) by mouth 3 (three) times daily as needed for muscle spasms. 90 tablet 1  . fluticasone furoate-vilanterol (BREO  ELLIPTA) 100-25 MCG/INH AEPB Inhale 1 puff into the lungs daily. 1 each 11  . ipratropium (ATROVENT) 0.06 % nasal spray Place 2 sprays into both nostrils every 4 (four) hours as needed for rhinitis. 10 mL 6  . irbesartan-hydrochlorothiazide (AVALIDE) 150-12.5 MG tablet Take 1 tablet by mouth daily. 90 tablet 0  . traMADol (ULTRAM) 50 MG tablet Take 1 tablet (50 mg total) by mouth every 8 (eight) hours as needed. 15 tablet 0  . traZODone (DESYREL) 50 MG tablet Take 0.5-1 tablets (25-50 mg total) by mouth at bedtime as needed for sleep. 30 tablet 3  . triamcinolone ointment (KENALOG) 0.5 % Apply 1 application topically 2 (two) times daily. To affected area, avoid eyes and face 30 g 3   No current facility-administered medications for this visit.    Allergies  Allergen Reactions  . Ace Inhibitors Cough    Cough    Health Maintenance Health Maintenance  Topic Date Due  . HIV Screening  12/09/1983  . TETANUS/TDAP  12/09/1987  . INFLUENZA VACCINE  04/14/2018 (Originally 03/25/2017)     Exam:  BP (!) 161/98   Pulse 76   Ht 5\' 3"  (1.6 m)   Wt 158 lb (71.7 kg)   BMI 27.99 kg/m  Gen: Well NAD HEENT: EOMI,  MMM Lungs: Normal work of breathing. CTABL Heart: RRR no MRG Abd: NABS, Soft. Nondistended, Nontender Exts: Brisk capillary refill, warm and well perfused.    No results found for this or any previous visit (from the past 72 hour(s)). No results found.    Assessment  and Plan: 47 y.o. female with  Hypertension: Not well controlled.  Plan to double Avalide to 300/25.  Recheck blood pressure in a month or so.  Back pain: Doing well.  Intermittent flareups are reasonable.  Home exercise program and physical therapy as needed.   No orders of the defined types were placed in this encounter.  No orders of the defined types were placed in this encounter.    Discussed warning signs or symptoms. Please see discharge instructions. Patient expresses understanding.

## 2017-09-10 ENCOUNTER — Ambulatory Visit: Payer: BLUE CROSS/BLUE SHIELD

## 2017-10-07 ENCOUNTER — Ambulatory Visit: Payer: BLUE CROSS/BLUE SHIELD | Admitting: Family Medicine

## 2017-10-07 DIAGNOSIS — Z0189 Encounter for other specified special examinations: Secondary | ICD-10-CM

## 2017-10-12 ENCOUNTER — Encounter: Payer: Self-pay | Admitting: Emergency Medicine

## 2017-10-12 ENCOUNTER — Emergency Department (INDEPENDENT_AMBULATORY_CARE_PROVIDER_SITE_OTHER)
Admission: EM | Admit: 2017-10-12 | Discharge: 2017-10-12 | Disposition: A | Discharge status: Home / Self Care | Payer: BLUE CROSS/BLUE SHIELD | Source: Home / Self Care | Attending: Family Medicine | Admitting: Family Medicine

## 2017-10-12 DIAGNOSIS — B9689 Other specified bacterial agents as the cause of diseases classified elsewhere: Secondary | ICD-10-CM | POA: Diagnosis not present

## 2017-10-12 DIAGNOSIS — J029 Acute pharyngitis, unspecified: Secondary | ICD-10-CM

## 2017-10-12 DIAGNOSIS — J208 Acute bronchitis due to other specified organisms: Secondary | ICD-10-CM

## 2017-10-12 MED ORDER — BENZONATATE 100 MG PO CAPS: 100.0000 mg | ORAL_CAPSULE | Freq: Three times a day (TID) | ORAL | 0 refills | Status: DC

## 2017-10-12 MED ORDER — AZITHROMYCIN 250 MG PO TABS: 250.0000 mg | ORAL_TABLET | Freq: Every day | ORAL | 0 refills | Status: DC

## 2017-10-12 MED ORDER — PREDNISONE 20 MG PO TABS: ORAL_TABLET | ORAL | 0 refills | Status: DC

## 2017-10-12 NOTE — Discharge Instructions (Signed)
°  You may take 500mg acetaminophen every 4-6 hours or in combination with ibuprofen 400-600mg every 6-8 hours as needed for pain, inflammation, and fever. ° °Be sure to drink at least eight 8oz glasses of water to stay well hydrated and get at least 8 hours of sleep at night, preferably more while sick.  ° °Please take antibiotics as prescribed and be sure to complete entire course even if you start to feel better to ensure infection does not come back. ° °

## 2017-10-12 NOTE — ED Provider Notes (Signed)
[facility]    CSN: [id number] Arrival date & time: 10/12/17  1057     History   Chief Complaint Chief Complaint  Patient presents with  . Sore Throat    HPI Lindsay George is a 47 y.o. female.   HPI Lindsay George is a 47 y.o. female presenting to UC with c/o worsening sore throat and cough.  She was seen yesterday at a different urgent care after 6 days of n/v/d, cough, congestion, and sore throat. Her Rapid Flu and Strep tests were negative, however, pt was still started on Tamiflu for potential flu.  She has been taking that along with mucinex and cough syrup.  She has been able to keep down fluids but throat pain is moderate to severe. She does have hx of asthma and has needed to use her inhaler more often.  Denies n/v/d for the last 3-4 days.    BP elevated in triage. Hx of HTN. She has been taking OTC decongestants.   Past Medical History:  Diagnosis Date  . History of hysterectomy   . Hypertension     Patient Active Problem List   Diagnosis Date Noted  . Cough variant asthma 08/06/2017  . Insomnia 08/15/2016  . Abnormal mammogram of left breast 06/24/2016  . 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 Bursitis of left hip 05/11/2015  . 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 HTN (hypertension) 04/19/2015  . Lumbago 04/19/2015  . History of hysterectomy 04/19/2015  . Breast cancer screening 04/19/2015    Past Surgical History:  Procedure Laterality Date  . TAH RSO 2012 for uterine fibroids      OB History    No data available       Home Medications    Prior to Admission medications   Medication Sig Start Date End Date Taking? Authorizing Provider  oseltamivir (TAMIFLU) 30 MG capsule Take 30 mg by mouth.   Yes [provider]  albuterol (PROVENTIL HFA;VENTOLIN HFA) 108 (90 Base) MCG/ACT inhaler Inhale 2 puffs into the lungs every 6 (six) hours as needed for wheezing or shortness of breath. 08/06/17   Gregor Hams, MD  amLODipine (NORVASC) 10  MG tablet TAKE 1 TABLET BY MOUTH ONCE DAILY . 04/29/17   Gregor Hams, MD  azithromycin (ZITHROMAX) 250 MG tablet Take 1 tablet (250 mg total) by mouth daily. Take first 2 tablets together, then 1 every day until finished. 10/12/17   Noe Gens, PA-C  benzonatate (TESSALON) 100 MG capsule Take 1-2 capsules (100-200 mg total) by mouth every 8 (eight) hours. 10/12/17   Noe Gens, PA-C  cyclobenzaprine (FLEXERIL) 5 MG tablet Take 1 tablet (5 mg total) by mouth 3 (three) times daily as needed for muscle spasms. 04/19/15   Gregor Hams, MD  fluticasone furoate-vilanterol (BREO ELLIPTA) 100-25 MCG/INH AEPB Inhale 1 puff into the lungs daily. 08/06/17   Gregor Hams, MD  ipratropium (ATROVENT) 0.06 % nasal spray Place 2 sprays into both nostrils every 4 (four) hours as needed for rhinitis. 11/08/15   Gregor Hams, MD  irbesartan-hydrochlorothiazide (AVALIDE) 150-12.5 MG tablet Take 2 tablets by mouth daily. 09/03/17   Gregor Hams, MD  predniSONE (DELTASONE) 20 MG tablet 3 tabs po day one, then 2 po daily x 4 days 10/12/17   Noe Gens, PA-C  traMADol (ULTRAM) 50 MG tablet Take 1 tablet (50 mg total) by mouth every 8 (eight) hours as needed. 04/29/17   Gregor Hams, MD  traZODone (DESYREL) 50 MG tablet Take 0.5-1 tablets (25-50 mg total) by mouth at bedtime as needed for sleep. 08/15/16   Gregor Hams, MD  triamcinolone ointment (KENALOG) 0.5 % Apply 1 application topically 2 (two) times daily. To affected area, avoid eyes and face 04/19/15   Gregor Hams, MD    Family History Family History  Problem Relation Age of Onset  . Hypertension Mother   . Breast cancer Mother 84  . Hypertension Father   . Hypertension Sister   . Hypertension Brother   . Hypertension Daughter   . Hypertension Son   . Hypertension Maternal Aunt   . Hypertension Maternal Uncle   . Hypertension Paternal Aunt   . Hypertension Paternal Uncle   . Hypertension Maternal Grandmother   . Hypertension Maternal  Grandfather   . Hypertension Paternal Grandmother   . Hypertension Paternal Grandfather     Social History Social History   Tobacco Use  . Smoking status: Never Smoker  . Smokeless tobacco: Never Used  Substance Use Topics  . Alcohol use: Yes    Alcohol/week: 0.0 oz  . Drug use: No     Allergies   Ace inhibitors   Review of Systems Review of Systems  Constitutional: Positive for fatigue and fever. Negative for chills.  HENT: Positive for congestion, postnasal drip and sore throat. Negative for ear pain, trouble swallowing and voice change.   Respiratory: Negative for cough and shortness of breath.   Cardiovascular: Negative for chest pain and palpitations.  Gastrointestinal: Negative for abdominal pain, diarrhea, nausea and vomiting.  Musculoskeletal: Negative for arthralgias, back pain and myalgias.  Skin: Negative for rash.  Neurological: Positive for headaches. Negative for dizziness and light-headedness.     Physical Exam Triage Vital Signs ED Triage Vitals  Enc Vitals Group     BP 10/12/17 1142 (!) 160/102     Pulse Rate 10/12/17 1142 86     Resp --      Temp 10/12/17 1142 98.8 F (37.1 C)     Temp Source 10/12/17 1142 Oral     SpO2 10/12/17 1142 99 %     Weight 10/12/17 1143 152 lb (68.9 kg)     Height --      Head Circumference --      Peak Flow --      Pain Score 10/12/17 1142 0     Pain Loc --      Pain Edu? --      Excl. in [place]? --    No data found.  Updated Vital Signs BP (!) 160/102 (BP Location: Right Arm)   Pulse 86   Temp 98.8 F (37.1 C) (Oral)   Wt 152 lb (68.9 kg)   SpO2 99%   BMI 26.93 kg/m   Visual Acuity Right Eye Distance:   Left Eye Distance:   Bilateral Distance:    Right Eye Near:   Left Eye Near:    Bilateral Near:     Physical Exam  Constitutional: She is oriented to person, place, and time. She appears well-developed and well-nourished.  Non-toxic appearance. She does not appear ill. No distress.  HENT:  Head:  Normocephalic and atraumatic.  Right Ear: Tympanic membrane normal.  Left Ear: Tympanic membrane normal.  Nose: Mucosal edema present. Right sinus exhibits no maxillary sinus tenderness and no frontal sinus tenderness. Left sinus exhibits no maxillary sinus tenderness and no frontal sinus tenderness.  Mouth/Throat: Uvula is midline and mucous membranes are normal. Posterior oropharyngeal  erythema present. No oropharyngeal exudate, posterior oropharyngeal edema or tonsillar abscesses.  Eyes: EOM are normal.  Neck: Normal range of motion.  Cardiovascular: Normal rate and regular rhythm.  Pulmonary/Chest: Effort normal. No stridor. No respiratory distress. She has wheezes. She has rhonchi.  Diffuse faint wheeze and rhonchi.  Musculoskeletal: Normal range of motion.  Neurological: She is alert and oriented to person, place, and time.  Skin: Skin is warm and dry.  Psychiatric: She has a normal mood and affect. Her behavior is normal.  Nursing note and vitals reviewed.    UC Treatments / Results  Labs (all labs ordered are listed, but only abnormal results are displayed) Labs Reviewed - No data to display  EKG  EKG Interpretation None       Radiology No results found.  Procedures Procedures (including critical care time)  Medications Ordered in UC Medications - No data to display   Initial Impression / Assessment and Plan / UC Course  I have reviewed the triage vital signs and the nursing notes.  Pertinent labs & imaging results that were available during my care of the patient were reviewed by me and considered in my medical decision making (see chart for details).     Hx and exam c/w acute bronchitis, likely secondary to influenza. Throat exam not concerning for strep or tonsillar abscess. Will start on antibiotics for secondary bacterial bronchitis Encouraged to take azithromycin and prednisone as prescribed Pt declined refill on inhaler, states she has enough at  home. Encouraged fluids and rest Monitor BP at home  Final Clinical Impressions(s) / UC Diagnoses   Final diagnoses:  Acute bacterial bronchitis  Pharyngitis, unspecified etiology    ED Discharge Orders        Ordered    azithromycin (ZITHROMAX) 250 MG tablet  Daily     10/12/17 1200    predniSONE (DELTASONE) 20 MG tablet     10/12/17 1200    benzonatate (TESSALON) 100 MG capsule  Every 8 hours     10/12/17 1200       Controlled Substance Prescriptions [place] Controlled Substance Registry consulted? Not Applicable   Tyrell Antonio 10/12/17 1256

## 2017-10-12 NOTE — ED Triage Notes (Signed)
Pt states she was seen yesterday at another urgent care and dx with flu./ negative flu and strep test but pt states her throat is hurting worse today. She is taking tamiflu, mucinex and cough syrup.

## 2017-10-13 ENCOUNTER — Ambulatory Visit: Payer: BLUE CROSS/BLUE SHIELD | Admitting: Family Medicine

## 2017-10-14 ENCOUNTER — Encounter: Payer: Self-pay | Admitting: Family Medicine

## 2017-10-22 ENCOUNTER — Ambulatory Visit: Payer: BLUE CROSS/BLUE SHIELD | Admitting: Family Medicine

## 2017-10-22 ENCOUNTER — Encounter: Payer: Self-pay | Admitting: Family Medicine

## 2017-10-22 VITALS — BP 180/114 | HR 59 | Temp 98.3°F | Ht 64.0 in | Wt 154.0 lb

## 2017-10-22 DIAGNOSIS — J45991 Cough variant asthma: Secondary | ICD-10-CM | POA: Diagnosis not present

## 2017-10-22 DIAGNOSIS — I10 Essential (primary) hypertension: Secondary | ICD-10-CM

## 2017-10-22 DIAGNOSIS — Z23 Encounter for immunization: Secondary | ICD-10-CM

## 2017-10-22 DIAGNOSIS — R05 Cough: Secondary | ICD-10-CM

## 2017-10-22 DIAGNOSIS — R059 Cough, unspecified: Secondary | ICD-10-CM

## 2017-10-22 MED ORDER — PREDNISONE 50 MG PO TABS: 50.0000 mg | ORAL_TABLET | Freq: Every day | ORAL | 0 refills | Status: DC

## 2017-10-22 MED ORDER — FLUTICASONE FUROATE-VILANTEROL 200-25 MCG/INH IN AEPB: 1.0000 | INHALATION_SPRAY | Freq: Every day | RESPIRATORY_TRACT | 3 refills | Status: DC

## 2017-10-22 MED ORDER — GUAIFENESIN-CODEINE 100-10 MG/5ML PO SOLN: 5.0000 mL | Freq: Four times a day (QID) | ORAL | 0 refills | Status: DC | PRN

## 2017-10-22 MED ORDER — AMLODIPINE BESYLATE 10 MG PO TABS: ORAL_TABLET | ORAL | 1 refills | Status: DC

## 2017-10-22 MED ORDER — ALBUTEROL SULFATE HFA 108 (90 BASE) MCG/ACT IN AERS: 2.0000 | INHALATION_SPRAY | Freq: Four times a day (QID) | RESPIRATORY_TRACT | 1 refills | Status: DC | PRN

## 2017-10-22 MED ORDER — OMEPRAZOLE 40 MG PO CPDR: 40.0000 mg | DELAYED_RELEASE_CAPSULE | Freq: Every day | ORAL | 0 refills | Status: DC

## 2017-10-22 NOTE — Patient Instructions (Addendum)
Thank you for coming in today. Get xray now.  We will contact you with results likely tomorrow.  Start amlodipine dialy.  Continue ibersartan/HCTZ 2 pills daily.  I dont think daily isosorbide will help but send me the prescription info tomorrow.  For cough increase breo to the 200 dose daily.  Use albuterol as needed.  Use tessalon during the day and codeine at night.  Consider sugar free cough drops that have menthol Try omeprazole daily.  Recheck in 1 month.   Send me a 2 week update.     Cough, Adult A cough helps to clear your throat and lungs. A cough may last only 2-3 weeks (acute), or it may last longer than 8 weeks (chronic). Many different things can cause a cough. A cough may be a sign of an illness or another medical condition. Follow these instructions at home:  Pay attention to any changes in your cough.  Take medicines only as told by your doctor. ? If you were prescribed an antibiotic medicine, take it as told by your doctor. Do not stop taking it even if you start to feel better. ? Talk with your doctor before you try using a cough medicine.  Drink enough fluid to keep your pee (urine) clear or pale yellow.  If the air is dry, use a cold steam vaporizer or humidifier in your home.  Stay away from things that make you cough at work or at home.  If your cough is worse at night, try using extra pillows to raise your head up higher while you sleep.  Do not smoke, and try not to be around smoke. If you need help quitting, ask your doctor.  Do not have caffeine.  Do not drink alcohol.  Rest as needed. Contact a doctor if:  You have new problems (symptoms).  You cough up yellow fluid (pus).  Your cough does not get better after 2-3 weeks, or your cough gets worse.  Medicine does not help your cough and you are not sleeping well.  You have pain that gets worse or pain that is not helped with medicine.  You have a fever.  You are losing weight and you  do not know why.  You have night sweats. Get help right away if:  You cough up blood.  You have trouble breathing.  Your heartbeat is very fast. This information is not intended to replace advice given to you by your health care provider. Make sure you discuss any questions you have with your health care provider. Document Released: 04/24/2011 Document Revised: 01/17/2016 Document Reviewed: 10/18/2014 Elsevier Interactive Patient Education  [employer].

## 2017-10-23 NOTE — Progress Notes (Signed)
Lindsay George is a 47 y.o. female who presents to [facility]: [place] today for hypertension cough and congestion.  Hypertension: Lindsay George has a long history of difficult to control hypertension.  She currently is taking Avalide 150/12.5 2 pills daily.  She is not taking the amlodipine listed below because she thought she was supposed to stop it.  She was seen in the emergency department recently for cough where she was prescribed isosorbide 1 pill daily.  She notes this does not seem to be helping her blood pressure at all but she is tolerating it.  She notes her blood pressure is elevated when she checks it at home in the 160s-180s range.  She denies chest pain or palpitations lightheadedness or dizziness.  Additionally Lindsay George notes a significant cough.  She suffered a respiratory illness about a month ago thought to be likely viral.  She is been seen in the emergency room or urgent care twice prescribed prednisone and azithromycin.  She notes the prednisone helped a bit but the cough persists.  She uses her albuterol inhaler which does seem to help.  She describes the cough as occasionally productive but often nonproductive.  She does note some wheezing.  Additionally she continues to take Brio 100/25 daily.  She is tried using some over-the-counter medications for cough suppression which have helped.  She notes that an old leftover codeine cough suppression medicine also helped at nighttime.   Past Medical History:  Diagnosis Date  . History of hysterectomy   . Hypertension    Past Surgical History:  Procedure Laterality Date  . TAH RSO 2012 for uterine fibroids     Social History   Tobacco Use  . Smoking status: Never Smoker  . Smokeless tobacco: Never Used  Substance Use Topics  . Alcohol use: Yes    Alcohol/week: 0.0 oz   family history includes Breast cancer (age of  onset: 30) in her mother; Hypertension in her brother, daughter, father, maternal aunt, maternal grandfather, maternal grandmother, maternal uncle, mother, paternal aunt, paternal grandfather, paternal grandmother, paternal uncle, sister, and son.  ROS as above:  Medications: Current Outpatient Medications  Medication Sig Dispense Refill  . albuterol (PROVENTIL HFA;VENTOLIN HFA) 108 (90 Base) MCG/ACT inhaler Inhale 2 puffs into the lungs every 6 (six) hours as needed for wheezing or shortness of breath. 1 Inhaler 1  . amLODipine (NORVASC) 10 MG tablet TAKE 1 TABLET BY MOUTH ONCE DAILY . 90 tablet 1  . cyclobenzaprine (FLEXERIL) 5 MG tablet Take 1 tablet (5 mg total) by mouth 3 (three) times daily as needed for muscle spasms. 90 tablet 1  . ipratropium (ATROVENT) 0.06 % nasal spray Place 2 sprays into both nostrils every 4 (four) hours as needed for rhinitis. 10 mL 6  . irbesartan-hydrochlorothiazide (AVALIDE) 150-12.5 MG tablet Take 2 tablets by mouth daily. 180 tablet 0  . traMADol (ULTRAM) 50 MG tablet Take 1 tablet (50 mg total) by mouth every 8 (eight) hours as needed. 15 tablet 0  . traZODone (DESYREL) 50 MG tablet Take 0.5-1 tablets (25-50 mg total) by mouth at bedtime as needed for sleep. 30 tablet 3  . triamcinolone ointment (KENALOG) 0.5 % Apply 1 application topically 2 (two) times daily. To affected area, avoid eyes and face 30 g 3  . fluticasone furoate-vilanterol (BREO ELLIPTA) 200-25 MCG/INH AEPB Inhale 1 puff into the lungs daily. 30 each 3  . guaiFENesin-codeine 100-10 MG/5ML syrup Take 5 mLs by  mouth every 6 (six) hours as needed for cough. 120 mL 0  . omeprazole (PRILOSEC) 40 MG capsule Take 1 capsule (40 mg total) by mouth daily. 30 capsule 0  . predniSONE (DELTASONE) 50 MG tablet Take 1 tablet (50 mg total) by mouth daily. 5 tablet 0   No current facility-administered medications for this visit.    Allergies  Allergen Reactions  . Ace Inhibitors Cough    Cough     Health Maintenance Health Maintenance  Topic Date Due  . HIV Screening  12/09/1983  . INFLUENZA VACCINE  04/14/2018 (Originally 03/25/2017)  . TETANUS/TDAP  10/23/2027     Exam:  BP (!) 180/114   Pulse (!) 59   Temp 98.3 F (36.8 C) (Oral)   Ht 5\' 4"  (1.626 m)   Wt 154 lb (69.9 kg)   BMI 26.43 kg/m  Gen: Well NAD HEENT: EOMI,  MMM posterior pharynx with cobblestoning.  Clear nasal discharge. Lungs: Normal work of breathing.  Wheezing present bilaterally.  Frequent coughing. Heart: RRR no MRG Abd: NABS, Soft. Nondistended, Nontender Exts: Brisk capillary refill, warm and well perfused.   Chest x-ray ordered but not yet obtained.   No results found for this or any previous visit (from the past 72 hour(s)). No results found.    Assessment and Plan: 47 y.o. female with  Hypertension: Exacerbation of chronic illness not well controlled.  We had a discussion about her treatment options.  Plan to continue Avalide total 300/25 daily.  3 add amlodipine 10 mg daily.  Continue to check home blood pressure and get me an update in 2 weeks.  Next step may be beta-blocker versus Spironolactone.  Recheck in about a month.  Cough: Patient has a cough following a presumed viral illness.  This is likely complicated by her history of asthma.  She had treatments with antibiotics and steroids within a few weeks.  Given that she is quite symptomatic I think it is reasonable to repeat the steroid course.  Additionally will obtain a chest x-ray to double check no pneumonia or other infiltrates.  Will use Tessalon and codeine cough syrup for cough suppression.  Albuterol refilled.  However we will increase the dose of Brio for management of presumed reactive airway disease component in this cough.  Recheck in 1 week.   Asthma: As discussed above.  Orders Placed This Encounter  Procedures  . DG Chest 2 View    Order Specific Question:   Reason for exam:    Answer:   Cough, assess intra-thoracic  pathology    Order Specific Question:   Is the patient pregnant?    Answer:   No    Order Specific Question:   Preferred imaging location?    Answer:   [facility]  . Tdap vaccine greater than or equal to 7yo IM   Meds ordered this encounter  Medications  . fluticasone furoate-vilanterol (BREO ELLIPTA) 200-25 MCG/INH AEPB    Sig: Inhale 1 puff into the lungs daily.    Dispense:  30 each    Refill:  3  . albuterol (PROVENTIL HFA;VENTOLIN HFA) 108 (90 Base) MCG/ACT inhaler    Sig: Inhale 2 puffs into the lungs every 6 (six) hours as needed for wheezing or shortness of breath.    Dispense:  1 Inhaler    Refill:  1  . predniSONE (DELTASONE) 50 MG tablet    Sig: Take 1 tablet (50 mg total) by mouth daily.    Dispense:  5 tablet  Refill:  0  . amLODipine (NORVASC) 10 MG tablet    Sig: TAKE 1 TABLET BY MOUTH ONCE DAILY .    Dispense:  90 tablet    Refill:  1  . guaiFENesin-codeine 100-10 MG/5ML syrup    Sig: Take 5 mLs by mouth every 6 (six) hours as needed for cough.    Dispense:  120 mL    Refill:  0  . omeprazole (PRILOSEC) 40 MG capsule    Sig: Take 1 capsule (40 mg total) by mouth daily.    Dispense:  30 capsule    Refill:  0     Discussed warning signs or symptoms. Please see discharge instructions. Patient expresses understanding.

## 2017-11-09 ENCOUNTER — Ambulatory Visit (INDEPENDENT_AMBULATORY_CARE_PROVIDER_SITE_OTHER): Payer: BLUE CROSS/BLUE SHIELD | Admitting: Family Medicine

## 2017-11-09 ENCOUNTER — Encounter: Payer: Self-pay | Admitting: Family Medicine

## 2017-11-09 VITALS — BP 135/86 | HR 84 | Temp 98.4°F | Wt 152.0 lb

## 2017-11-09 DIAGNOSIS — J029 Acute pharyngitis, unspecified: Secondary | ICD-10-CM

## 2017-11-09 LAB — POCT RAPID STREP A (OFFICE): RAPID STREP A SCREEN: NEGATIVE

## 2017-11-09 MED ORDER — CEFDINIR 300 MG PO CAPS: 300.0000 mg | ORAL_CAPSULE | Freq: Two times a day (BID) | ORAL | 0 refills | Status: DC

## 2017-11-09 MED ORDER — CLOTRIMAZOLE 10 MG MT TROC: 10.0000 mg | Freq: Every day | OROMUCOSAL | 1 refills | Status: AC

## 2017-11-09 NOTE — Progress Notes (Addendum)
Lindsay George is a 47 y.o. female who presents to [facility] [place]: [place] today for evaluation of a sore throat. The patient states that she began experiencing pain in her throat with speaking and swallowing approximately 5 days ago. She denies fever, shortness of breath, and cough. She states she began taking an old Amoxicillin prescription for 3 days without improvement. She states that her respiratory status has been improved since she began taking Atrovan, and she denies any recent shortness of breath.  She has been continuing the higher dose of the Brio prescription which has helped her breathing.  She notes most the time she brushes her teeth after she uses it but does not always wash her mouth out.   Past Medical History:  Diagnosis Date  . History of hysterectomy   . Hypertension    Past Surgical History:  Procedure Laterality Date  . TAH RSO 2012 for uterine fibroids     Social History   Tobacco Use  . Smoking status: Never Smoker  . Smokeless tobacco: Never Used  Substance Use Topics  . Alcohol use: Yes    Alcohol/week: 0.0 oz   family history includes Breast cancer (age of onset: 71) in her mother; Hypertension in her brother, daughter, father, maternal aunt, maternal grandfather, maternal grandmother, maternal uncle, mother, paternal aunt, paternal grandfather, paternal grandmother, paternal uncle, sister, and son.  ROS as above:  Medications: Current Outpatient Medications  Medication Sig Dispense Refill  . albuterol (PROVENTIL HFA;VENTOLIN HFA) 108 (90 Base) MCG/ACT inhaler Inhale 2 puffs into the lungs every 6 (six) hours as needed for wheezing or shortness of breath. 1 Inhaler 1  . amLODipine (NORVASC) 10 MG tablet TAKE 1 TABLET BY MOUTH ONCE DAILY . 90 tablet 1  . cyclobenzaprine (FLEXERIL) 5 MG tablet Take 1 tablet (5 mg total) by mouth 3 (three) times  daily as needed for muscle spasms. 90 tablet 1  . fluticasone furoate-vilanterol (BREO ELLIPTA) 200-25 MCG/INH AEPB Inhale 1 puff into the lungs daily. 30 each 3  . ipratropium (ATROVENT) 0.06 % nasal spray Place 2 sprays into both nostrils every 4 (four) hours as needed for rhinitis. 10 mL 6  . irbesartan-hydrochlorothiazide (AVALIDE) 150-12.5 MG tablet Take 2 tablets by mouth daily. 180 tablet 0  . omeprazole (PRILOSEC) 40 MG capsule Take 1 capsule (40 mg total) by mouth daily. 30 capsule 0  . traMADol (ULTRAM) 50 MG tablet Take 1 tablet (50 mg total) by mouth every 8 (eight) hours as needed. 15 tablet 0  . traZODone (DESYREL) 50 MG tablet Take 0.5-1 tablets (25-50 mg total) by mouth at bedtime as needed for sleep. 30 tablet 3  . triamcinolone ointment (KENALOG) 0.5 % Apply 1 application topically 2 (two) times daily. To affected area, avoid eyes and face 30 g 3  . cefdinir (OMNICEF) 300 MG capsule Take 1 capsule (300 mg total) by mouth 2 (two) times daily. 14 capsule 0  . clotrimazole (MYCELEX) 10 MG troche Take 1 tablet (10 mg total) by mouth 5 (five) times daily for 10 days. 70 tablet 1   No current facility-administered medications for this visit.    Allergies  Allergen Reactions  . Ace Inhibitors Cough    Cough    Health Maintenance Health Maintenance  Topic Date Due  . HIV Screening  12/09/1983  . INFLUENZA VACCINE  04/14/2018 (Originally 03/25/2017)  . TETANUS/TDAP  10/23/2027     Exam:  BP 135/86  Pulse 84   Temp 98.4 F (36.9 C) (Oral)   Wt 152 lb (68.9 kg)   BMI 26.09 kg/m  Gen: Well NAD HEENT: EOMI,  MMM. Pharynx is erythematous with small 1 mm white spots on the right sided tonsils. Nasal turbinates are inflamed bilaterally with clear nasal discharge.  Mild cervical lymphadenopathy. Lungs: Normal work of breathing. CTABL Heart: RRR no MRG Abd: NABS, Soft. Nondistended, Nontender Exts: Brisk capillary refill, warm and well perfused.    Results for orders placed  or performed in visit on 11/09/17 (from the past 72 hour(s))  POCT rapid strep A     Status: None   Collection Time: 11/09/17  1:35 PM  Result Value Ref Range   Rapid Strep A Screen Negative Negative   No results found.    Assessment and Plan: 47 y.o. female with   Sore throat: She was seen 2 weeks prior for evaluation of a cough that was likely due to a viral illness that was treated with a steroid course. Today she states she only has a sore throat, and is otherwise normal. . Today she was negative for strep throat but the short course of antibiotics increases the likelihood of a false negative. I suspect this is a viral illness that will resolve on its own. However, the patient is utilizing an inhaled steroid (Brio), the likelihood of an oral yeast infection is higher and the physical exam finding of small white dots increases suspicion of oral candidiasis as well. We will prescribe clotrimazole.  If symptoms do not improve, we have prescribed a Omnicef to be picked up if no improvement in two days.   If still no improvement is seen, call the office and we will proceed with steroids.     Seasonal allergies is also on the differential, for which she can treat with OTC anti-histamines.    Orders Placed This Encounter  Procedures  . POCT rapid strep A   Meds ordered this encounter  Medications  . clotrimazole (MYCELEX) 10 MG troche    Sig: Take 1 tablet (10 mg total) by mouth 5 (five) times daily for 10 days.    Dispense:  70 tablet    Refill:  1  . cefdinir (OMNICEF) 300 MG capsule    Sig: Take 1 capsule (300 mg total) by mouth 2 (two) times daily.    Dispense:  14 capsule    Refill:  0     Discussed warning signs or symptoms. Please see discharge instructions. Patient expresses understanding.  This patient was seen and interviewed and examined independently by myself.  Lynne Leader, MD

## 2017-11-09 NOTE — Patient Instructions (Addendum)
Thank you for coming in today. Use the clotrimazole yeast medicine.  If not better next step is omnicef.  If not better then next step is prednisone.  Make sure to rinse your mouth after BREO.    Pharyngitis Pharyngitis is a sore throat (pharynx). There is redness, pain, and swelling of your throat. Follow these instructions at home:  Drink enough fluids to keep your pee (urine) clear or pale yellow.  Only take medicine as told by your doctor. ? You may get sick again if you do not take medicine as told. Finish your medicines, even if you start to feel better. ? Do not take aspirin.  Rest.  Rinse your mouth (gargle) with salt water ( tsp of salt per 1 qt of water) every 1-2 hours. This will help the pain.  If you are not at risk for choking, you can suck on hard candy or sore throat lozenges. Contact a doctor if:  You have large, tender lumps on your neck.  You have a rash.  You cough up green, yellow-brown, or bloody spit. Get help right away if:  You have a stiff neck.  You drool or cannot swallow liquids.  You throw up (vomit) or are not able to keep medicine or liquids down.  You have very bad pain that does not go away with medicine.  You have problems breathing (not from a stuffy nose). This information is not intended to replace advice given to you by your health care provider. Make sure you discuss any questions you have with your health care provider. Document Released: 01/28/2008 Document Revised: 01/17/2016 Document Reviewed: 04/18/2013 Elsevier Interactive Patient Education  2017 [employer].

## 2018-03-04 ENCOUNTER — Ambulatory Visit (INDEPENDENT_AMBULATORY_CARE_PROVIDER_SITE_OTHER): Payer: BLUE CROSS/BLUE SHIELD | Admitting: Family Medicine

## 2018-03-04 ENCOUNTER — Encounter: Payer: Self-pay | Admitting: Family Medicine

## 2018-03-04 VITALS — BP 158/97 | HR 97 | Ht 63.0 in | Wt 158.0 lb

## 2018-03-04 DIAGNOSIS — I10 Essential (primary) hypertension: Secondary | ICD-10-CM

## 2018-03-04 DIAGNOSIS — G8929 Other chronic pain: Secondary | ICD-10-CM

## 2018-03-04 DIAGNOSIS — M4316 Spondylolisthesis, lumbar region: Secondary | ICD-10-CM | POA: Diagnosis not present

## 2018-03-04 DIAGNOSIS — M5440 Lumbago with sciatica, unspecified side: Secondary | ICD-10-CM

## 2018-03-04 MED ORDER — CARVEDILOL 6.25 MG PO TABS
6.2500 mg | ORAL_TABLET | Freq: Two times a day (BID) | ORAL | 1 refills | Status: DC
Start: 2018-03-04 — End: 2018-09-16

## 2018-03-04 NOTE — Progress Notes (Signed)
Lindsay George is a 47 y.o. female who presents to [facility] [place] today for back pain with radiating pain down both legs, and hypertension.  Lindsay George has a history of back pain and spondylolisthesis.  This is been previously pretty well managed with physical therapy however her symptoms have been slowly worsening over the last few years.  Recently she notes pain radiating down both legs moderate back pain.  She denies any weakness or numbness of bowel bladder dysfunction.  She denies fevers or chills.  She denies recent injury.  She notes that she is been working mandatory overtime at work 49 to 60 hours/week and notes that that is making her back pain symptoms much worse.  She has difficulty with prolonged sitting and prolonged standing.  She can manage and outer day pretty well but multiple 12-hour days in a row are extremely difficult.   Hypertension: Currently takes Avalide 300/25 daily as well as amlodipine 10 mg daily.  She denies chest pain palpitations shortness of breath.  She denies lightheadedness or dizziness.  She notes her blood pressure is not well controlled at home.   ROS as above:  Exam:  BP (!) 158/97   Pulse 97   Ht 5\' 3"  (1.6 m)   Wt 158 lb (71.7 kg)   BMI 27.99 kg/m  Gen: Well NAD HEENT: EOMI,  MMM Lungs: Normal work of breathing. CTABL Heart: RRR no MRG Abd: NABS, Soft. Nondistended, Nontender Exts: Brisk capillary refill, warm and well perfused.  L-spine: Nontender to midline. Lumbar motion normal flexion normal rotation limited extension due to pain. Laboratory strength is intact throughout.  Reflexes are equal normal bilaterally.  Sensation is intact throughout. Psych: Alert and oriented normal speech thought process and affect.  No SI or HI expressed.    Lab and Radiology Results EXAM: LUMBAR SPINE - COMPLETE 4+ VIEW  COMPARISON:   None.  FINDINGS: AP, lateral, and oblique views of the lumbar spine are provided. There are bilateral chronic pars interarticularis defects at the L4 level somewhat obscured by callus formation and osseous spurring. There is associated mild (grade 1) anterolisthesis of L4 on L5. Alignment is otherwise normal.  No acute-appearing fracture line or displaced fracture fragment. Facet joints remain well aligned throughout. Paravertebral soft tissues are unremarkable. Sacrum appears intact and well aligned.  IMPRESSION: Bilateral chronic-appearing pars interarticularis defects at the L4 level which are somewhat obscured by callus formation and osseous spurring. 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  No acute findings.   Electronically Signed   By: Franki Cabot M.D.   On: 04/19/2015 13:04 I personally (independently) visualized and performed the interpretation of the images attached in this note.    Assessment and Plan: 47 y.o. female with  Back pain with radicular symptoms: Symptoms are slowly worsening.  However she has had more worsening recently with increased job demands.  Plan to do if MIP work to limit job possibilities as well as obtain MRI of lumbar spine to plan for epidural or facet injections or consider surgery.  Hypertension: Blood pressure not well controlled.  Start Coreg.  Continue Avalide and amlodipine.  Recheck following MRI in a week or 2 for blood pressure recheck as well.   Orders Placed This Encounter  Procedures  . MR Lumbar Spine Wo Contrast    Standing Status:   Future    Standing Expiration Date:   05/06/2019    Order Specific Question:   **  REASON FOR EXAM (FREE TEXT)    Answer:   BL L5 radicualr symptoms and L4/5 spondlythesis    Order Specific Question:   What is the patient's sedation requirement?    Answer:   No Sedation    Order Specific Question:   Does the patient have a pacemaker or implanted devices?    Answer:    No    Order Specific Question:   Preferred imaging location?    Answer:   [facility] development [facility] (table limit-350lbs)    Order Specific Question:   Radiology Contrast Protocol - do NOT remove file path    Answer:   [email or web address]   Meds ordered this encounter  Medications  . carvedilol (COREG) 6.25 MG tablet    Sig: Take 1 tablet (6.25 mg total) by mouth 2 (two) times daily with a meal.    Dispense:  60 tablet    Refill:  1     Historical information moved to improve visibility of documentation.  Past Medical History:  Diagnosis Date  . History of hysterectomy   . Hypertension    Past Surgical History:  Procedure Laterality Date  . TAH RSO 2012 for uterine fibroids     Social History   Tobacco Use  . Smoking status: Never Smoker  . Smokeless tobacco: Never Used  Substance Use Topics  . Alcohol use: Yes    Alcohol/week: 0.0 oz   family history includes Breast cancer (age of onset: 48) in her mother; Hypertension in her brother, daughter, father, maternal aunt, maternal grandfather, maternal grandmother, maternal uncle, mother, paternal aunt, paternal grandfather, paternal grandmother, paternal uncle, sister, and son.  Medications: Current Outpatient Medications  Medication Sig Dispense Refill  . albuterol (PROVENTIL HFA;VENTOLIN HFA) 108 (90 Base) MCG/ACT inhaler Inhale 2 puffs into the lungs every 6 (six) hours as needed for wheezing or shortness of breath. 1 Inhaler 1  . amLODipine (NORVASC) 10 MG tablet TAKE 1 TABLET BY MOUTH ONCE DAILY . 90 tablet 1  . cyclobenzaprine (FLEXERIL) 5 MG tablet Take 1 tablet (5 mg total) by mouth 3 (three) times daily as needed for muscle spasms. 90 tablet 1  . fluticasone furoate-vilanterol (BREO ELLIPTA) 200-25 MCG/INH AEPB Inhale 1 puff into the lungs daily. 30 each 3  . ipratropium (ATROVENT) 0.06 % nasal spray Place 2 sprays into both nostrils every 4 (four) hours as needed for rhinitis. 10 mL 6  .  irbesartan-hydrochlorothiazide (AVALIDE) 150-12.5 MG tablet Take 2 tablets by mouth daily. 180 tablet 0  . omeprazole (PRILOSEC) 40 MG capsule Take 1 capsule (40 mg total) by mouth daily. 30 capsule 0  . traMADol (ULTRAM) 50 MG tablet Take 1 tablet (50 mg total) by mouth every 8 (eight) hours as needed. 15 tablet 0  . traZODone (DESYREL) 50 MG tablet Take 0.5-1 tablets (25-50 mg total) by mouth at bedtime as needed for sleep. 30 tablet 3  . carvedilol (COREG) 6.25 MG tablet Take 1 tablet (6.25 mg total) by mouth 2 (two) times daily with a meal. 60 tablet 1   No current facility-administered medications for this visit.    Allergies  Allergen Reactions  . Ace Inhibitors Cough    Cough     Discussed warning signs or symptoms. Please see discharge instructions. Patient expresses understanding.

## 2018-03-04 NOTE — Patient Instructions (Addendum)
Thank you for coming in today. You should hear about MRI scheduling soon.  Recheck with me a few days after the MRI to discuss results and plan.  Return sooner if needed.   Start Coreg twice daily.   We will follow up BP at the next visit.   Carvedilol tablets What is this medicine? CARVEDILOL (KAR ve dil ol) is a beta-blocker. Beta-blockers reduce the workload on the heart and help it to beat more regularly. This medicine is used to treat high blood pressure and heart failure. This medicine may be used for other purposes; ask your health care provider or pharmacist if you have questions. COMMON BRAND NAME(S): Coreg What should I tell my health care provider before I take this medicine? They need to know if you have any of these conditions: -circulation problems -diabetes -history of heart attack or heart disease -liver disease -lung or breathing disease, like asthma or emphysema -pheochromocytoma -slow or irregular heartbeat -thyroid disease -an unusual or allergic reaction to carvedilol, other beta-blockers, medicines, foods, dyes, or preservatives -pregnant or trying to get pregnant -breast-feeding How should I use this medicine? Take this medicine by mouth with a glass of water. Follow the directions on the prescription label. It is best to take the tablets with food. Take your doses at regular intervals. Do not take your medicine more often than directed. Do not stop taking except on the advice of your doctor or health care professional. Talk to your pediatrician regarding the use of this medicine in children. Special care may be needed. Overdosage: If you think you have taken too much of this medicine contact a poison control center or emergency room at once. NOTE: This medicine is only for you. Do not share this medicine with others. What if I miss a dose? If you miss a dose, take it as soon as you can. If it is almost time for your next dose, take only that dose. Do not take  double or extra doses. What may interact with this medicine? This medicine may interact with the following medications: -certain medicines for blood pressure, heart disease, irregular heart beat -certain medicines for depression, like fluoxetine or paroxetine -certain medicines for diabetes, like glipizide or glyburide -cimetidine -clonidine -cyclosporine -digoxin -MAOIs like Carbex, Eldepryl, Marplan, Nardil, and Parnate -reserpine -rifampin This list may not describe all possible interactions. Give your health care provider a list of all the medicines, herbs, non-prescription drugs, or dietary supplements you use. Also tell them if you smoke, drink alcohol, or use illegal drugs. Some items may interact with your medicine. What should I watch for while using this medicine? Check your heart rate and blood pressure regularly while you are taking this medicine. Ask your doctor or health care professional what your heart rate and blood pressure should be, and when you should contact him or her. Do not stop taking this medicine suddenly. This could lead to serious heart-related effects. Contact your doctor or health care professional if you have difficulty breathing while taking this drug. Check your weight daily. Ask your doctor or health care professional when you should notify him/her of any weight gain. You may get drowsy or dizzy. Do not drive, use machinery, or do anything that requires mental alertness until you know how this medicine affects you. To reduce the risk of dizzy or fainting spells, do not sit or stand up quickly. Alcohol can make you more drowsy, and increase flushing and rapid heartbeats. Avoid alcoholic drinks. If you have diabetes, check  your blood sugar as directed. Tell your doctor if you have changes in your blood sugar while you are taking this medicine. If you are going to have surgery, tell your doctor or health care professional that you are taking this medicine. What  side effects may I notice from receiving this medicine? Side effects that you should report to your doctor or health care professional as soon as possible: -allergic reactions like skin rash, itching or hives, swelling of the face, lips, or tongue -breathing problems -dark urine -irregular heartbeat -swollen legs or ankles -vomiting -yellowing of the eyes or skin Side effects that usually do not require medical attention (report to your doctor or health care professional if they continue or are bothersome): -change in sex drive or performance -diarrhea -dry eyes (especially if wearing contact lenses) -dry, itching skin -headache -nausea -unusually tired This list may not describe all possible side effects. Call your doctor for medical advice about side effects. You may report side effects to FDA at 1-800-FDA-1088. Where should I keep my medicine? Keep out of the reach of children. Store at room temperature below 30 degrees C (86 degrees F). Protect from moisture. Keep container tightly closed. Throw away any unused medicine after the expiration date. NOTE: This sheet is a summary. It may not cover all possible information. If you have questions about this medicine, talk to your doctor, pharmacist, or health care provider.  2018 Elsevier/Gold Standard (2013-04-17 14:12:02)

## 2018-03-05 ENCOUNTER — Telehealth: Payer: Self-pay

## 2018-03-05 ENCOUNTER — Encounter: Payer: Self-pay | Admitting: Family Medicine

## 2018-03-05 DIAGNOSIS — M4316 Spondylolisthesis, lumbar region: Secondary | ICD-10-CM

## 2018-03-05 HISTORY — DX: Spondylolisthesis, lumbar region: M43.16

## 2018-03-05 NOTE — Telephone Encounter (Signed)
Pt called stating that she had an appt on Thursday 03-04-18 to have some paperwork filled out. Pt needs some information changed on one of the forms "I need the date changed so it can be extended until next week" and she also has 1 or 2 additional forms that need to be completed "to restrict me to only working 8 hour shifts"  Pt wanting to know if she should make an additional appt for these issues, or if she could just drop off the forms for Dr Georgina Snell to complete.  Please advise.

## 2018-03-05 NOTE — Telephone Encounter (Signed)
Its okay to drop the forms off.  Please clarify with patient that she need a work note out of work until next week or need me to change the FMLA form to start next week?

## 2018-03-05 NOTE — Telephone Encounter (Signed)
Pt advised to drop off paperwork.   She needs actual FMLA changed  I advised pt to attach sticky notes to the forms telling us exactly what needs to be changed

## 2018-03-07 ENCOUNTER — Other Ambulatory Visit: Payer: Self-pay | Admitting: Family Medicine

## 2018-03-09 ENCOUNTER — Telehealth: Payer: Self-pay

## 2018-03-09 NOTE — Telephone Encounter (Signed)
Notes done.  [facility] notified

## 2018-03-09 NOTE — Telephone Encounter (Signed)
Patient called several times stated that she dropped off some paperwork on last Friday to be completed and it is time sensitive. Please advise as patient is ready to pick up paperwork.Suanne Marker Hildy Nicholl,CMA

## 2018-03-22 ENCOUNTER — Ambulatory Visit (INDEPENDENT_AMBULATORY_CARE_PROVIDER_SITE_OTHER): Payer: BLUE CROSS/BLUE SHIELD

## 2018-03-22 ENCOUNTER — Telehealth: Payer: Self-pay | Admitting: Family Medicine

## 2018-03-22 DIAGNOSIS — M48061 Spinal stenosis, lumbar region without neurogenic claudication: Secondary | ICD-10-CM

## 2018-03-22 DIAGNOSIS — M5440 Lumbago with sciatica, unspecified side: Secondary | ICD-10-CM

## 2018-03-22 DIAGNOSIS — G8929 Other chronic pain: Secondary | ICD-10-CM

## 2018-03-22 DIAGNOSIS — M4316 Spondylolisthesis, lumbar region: Secondary | ICD-10-CM

## 2018-03-22 NOTE — Telephone Encounter (Signed)
Epidural steroid injection ordered 

## 2018-03-23 NOTE — Telephone Encounter (Signed)
[facility] has been notified. Rhonda Cunningham,CMA

## 2018-03-25 ENCOUNTER — Ambulatory Visit (INDEPENDENT_AMBULATORY_CARE_PROVIDER_SITE_OTHER): Payer: BLUE CROSS/BLUE SHIELD | Admitting: Family Medicine

## 2018-03-25 ENCOUNTER — Encounter: Payer: Self-pay | Admitting: Family Medicine

## 2018-03-25 VITALS — BP 141/81 | HR 75 | Wt 158.0 lb

## 2018-03-25 DIAGNOSIS — M48061 Spinal stenosis, lumbar region without neurogenic claudication: Secondary | ICD-10-CM | POA: Insufficient documentation

## 2018-03-25 DIAGNOSIS — M4316 Spondylolisthesis, lumbar region: Secondary | ICD-10-CM

## 2018-03-25 DIAGNOSIS — M47816 Spondylosis without myelopathy or radiculopathy, lumbar region: Secondary | ICD-10-CM

## 2018-03-25 NOTE — Progress Notes (Signed)
Lindsay George is a 47 y.o. female who presents to [facility] today for follow-up back pain.  Lindsay George has experienced back pain for years.  Been worsening recently and associated with pain radiating down her legs bilaterally.  She is been previously diagnosed with grade 1 spinal listhesis and did well with a trial of physical therapy.  However over the years the symptoms have worsened despite continuing her home exercise program.  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  She is tried multiple over-the-counter medications with only mild to moderate benefit.  No new bowel or bladder problems.  No new weakness or numbness.    ROS:  As above  Exam:  BP (!) 141/81   Pulse 75   Wt 158 lb (71.7 kg)   BMI 27.99 kg/m  General: Well Developed, well nourished, and in no acute distress.  Neuro/Psych: Alert and oriented x3, extra-ocular muscles intact, able to move all 4 extremities, sensation grossly intact. Skin: Warm and dry, no rashes noted.  Respiratory: Not using accessory muscles, speaking in full sentences, trachea midline.  Cardiovascular: Pulses palpable, no extremity edema. Abdomen: Does not appear distended. MSK:  L-spine nontender to midline. Lumbar motion: Normal flexion pain with extension. Lower extremity strength is intact.  Nonantalgic gait present.     Lab and Radiology Results No results found for this or any previous visit (from the past 72 hour(s)). Mr Lumbar Spine Wo Contrast  Result Date: 03/22/2018 CLINICAL DATA:  Spondylolisthesis lumbar spine. Bilateral L5 radicular symptoms. EXAM: MRI LUMBAR SPINE WITHOUT CONTRAST TECHNIQUE: Multiplanar, multisequence MR imaging of the lumbar spine was performed. No intravenous  contrast was administered. COMPARISON:  Lumbar spine radiographs 04/19/2015 FINDINGS: Segmentation:  Normal Alignment:  6 mm anterolisthesis L4-5.  Remaining alignment normal Vertebrae:  Negative for fracture or mass.  No pars defect. Conus medullaris and cauda equina: Conus extends to the L1-2 level. Conus and cauda equina appear normal. Paraspinal and other soft tissues: Negative for paraspinous mass or soft tissue edema Disc levels: L1-2: Mild facet degeneration.  Normal disc space. L2-3: Normal disc space.  Mild facet degeneration L3-4: Negative L4-5: Severe facet degeneration with facet bony hypertrophy and bilateral facet joint effusions. 6 mm anterolisthesis with diffuse bulging of the disc. Moderate to severe spinal stenosis. Severe subarticular stenosis bilaterally. Severe left foraminal encroachment with flattening of the left L4 nerve root. Moderate right foraminal stenosis. L5-S1: Negative IMPRESSION: 6 mm anterolisthesis L4-5 with severe facet degeneration. Moderate to severe spinal stenosis. Severe subarticular stenosis bilaterally and severe left foraminal encroachment Electronically Signed   By: Franchot Gallo M.D.   On: 03/22/2018 13:37  I personally (independently) visualized and performed the interpretation of the images attached in this note.      Assessment and Plan: 47 y.o. 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  Plan for epidural steroid injection.  Additionally given the patient's anatomy and severity of disease I am not optimistic about her being able to have sufficiently controlled symptoms in the long-term.  I think is reasonable to discuss her situation with a spine surgeon and have referred to neurosurgery.  I believe that if she does not have great symptom control with epidural steroid injection she  would be a good surgical candidate.  I spent 25 minutes with this patient, greater than 50% was face-to-face time  counseling regarding ddx, MRI findings and plan..    Orders Placed This Encounter  Procedures  . Ambulatory referral to Neurosurgery    Referral Priority:   Routine    Referral Type:   Surgical    Referral Reason:   Specialty Services Required    Requested Specialty:   Neurosurgery    Number of Visits Requested:   1   No orders of the defined types were placed in this encounter.   Historical information moved to improve visibility of documentation.  Past Medical History:  Diagnosis Date  . Hypertension   . Spondylolisthesis of lumbar region 03/05/2018   Past Surgical History:  Procedure Laterality Date  . TAH RSO 2012 for uterine fibroids     Social History   Tobacco Use  . Smoking status: Never Smoker  . Smokeless tobacco: Never Used  Substance Use Topics  . Alcohol use: Yes    Alcohol/week: 0.0 oz   family history includes Breast cancer (age of onset: 27) in her mother; Hypertension in her brother, daughter, father, maternal aunt, maternal grandfather, maternal grandmother, maternal uncle, mother, paternal aunt, paternal grandfather, paternal grandmother, paternal uncle, sister, and son.  Medications: Current Outpatient Medications  Medication Sig Dispense Refill  . albuterol (PROVENTIL HFA;VENTOLIN HFA) 108 (90 Base) MCG/ACT inhaler Inhale 2 puffs into the lungs every 6 (six) hours as needed for wheezing or shortness of breath. 1 Inhaler 1  . amLODipine (NORVASC) 10 MG tablet TAKE 1 TABLET BY MOUTH ONCE DAILY . 90 tablet 1  . carvedilol (COREG) 6.25 MG tablet Take 1 tablet (6.25 mg total) by mouth 2 (two) times daily with a meal. 60 tablet 1  . cyclobenzaprine (FLEXERIL) 5 MG tablet Take 1 tablet (5 mg total) by mouth 3 (three) times daily as needed for muscle spasms. 90 tablet 1  . fluticasone furoate-vilanterol (BREO ELLIPTA) 200-25 MCG/INH AEPB Inhale 1 puff into the lungs daily. 30 each 3  . ipratropium (ATROVENT) 0.06 % nasal spray Place 2 sprays into both nostrils  every 4 (four) hours as needed for rhinitis. 10 mL 6  . irbesartan-hydrochlorothiazide (AVALIDE) 150-12.5 MG tablet TAKE 1 TABLET BY MOUTH DAILY 30 tablet 2  . omeprazole (PRILOSEC) 40 MG capsule Take 1 capsule (40 mg total) by mouth daily. 30 capsule 0  . traMADol (ULTRAM) 50 MG tablet Take 1 tablet (50 mg total) by mouth every 8 (eight) hours as needed. 15 tablet 0  . traZODone (DESYREL) 50 MG tablet Take 0.5-1 tablets (25-50 mg total) by mouth at bedtime as needed for sleep. 30 tablet 3   No current facility-administered medications for this visit.    Allergies  Allergen Reactions  . Ace Inhibitors Cough    Cough      Discussed warning signs or symptoms. Please see discharge instructions. Patient expresses understanding.

## 2018-03-25 NOTE — Patient Instructions (Addendum)
Thank you for coming in today. You should hear from the neurosurgery office soon.  If you dont hear anything let me know.  If you do some research and you want to see someone else me know.   Come back or go to the emergency room if you notice new weakness new numbness problems walking or bowel or bladder problems.  Call or go to the ER if you develop a large red swollen joint with extreme pain or oozing puss.

## 2018-03-26 ENCOUNTER — Encounter: Payer: Self-pay | Admitting: Family Medicine

## 2018-04-01 ENCOUNTER — Ambulatory Visit
Admission: RE | Admit: 2018-04-01 | Discharge: 2018-04-01 | Disposition: A | Discharge status: Home / Self Care | Payer: BLUE CROSS/BLUE SHIELD | Source: Ambulatory Visit | Attending: Family Medicine | Admitting: Family Medicine

## 2018-04-01 MED ORDER — METHYLPREDNISOLONE ACETATE 40 MG/ML INJ SUSP (RADIOLOG
120.0000 mg | Freq: Once | INTRAMUSCULAR | Status: AC
  Administered 2018-04-01: 120 mg via EPIDURAL

## 2018-04-01 MED ORDER — IOPAMIDOL (ISOVUE-M 200) INJECTION 41%
1.0000 mL | Freq: Once | INTRAMUSCULAR | Status: AC
  Administered 2018-04-01: 1 mL via EPIDURAL

## 2018-04-01 NOTE — Discharge Instructions (Signed)

## 2018-04-07 ENCOUNTER — Ambulatory Visit (INDEPENDENT_AMBULATORY_CARE_PROVIDER_SITE_OTHER): Payer: BLUE CROSS/BLUE SHIELD | Admitting: Family Medicine

## 2018-04-07 ENCOUNTER — Encounter: Payer: Self-pay | Admitting: Family Medicine

## 2018-04-07 VITALS — BP 158/93 | HR 85 | Ht 64.0 in | Wt 157.0 lb

## 2018-04-07 DIAGNOSIS — G8929 Other chronic pain: Secondary | ICD-10-CM

## 2018-04-07 DIAGNOSIS — M48061 Spinal stenosis, lumbar region without neurogenic claudication: Secondary | ICD-10-CM | POA: Diagnosis not present

## 2018-04-07 DIAGNOSIS — M4316 Spondylolisthesis, lumbar region: Secondary | ICD-10-CM | POA: Diagnosis not present

## 2018-04-07 DIAGNOSIS — M5440 Lumbago with sciatica, unspecified side: Secondary | ICD-10-CM

## 2018-04-07 NOTE — Patient Instructions (Signed)
Thank you for coming in today.  Let me know if you have problems with the form today.  My cell phone number is [phone]. Texts work well.  Please continue to use normal methods of contact unless you have emergency and cannot get help immediately.   You can schedule the back injection in about 2 weeks or later if needed .

## 2018-04-07 NOTE — Progress Notes (Signed)
Lindsay George is a 47 y.o. female who presents to [facility] today for follow-up back pain.  Tajae was seen on August 1 for back pain following her lumbar MRI showing significant worsening spondylolisthesis with spinal stenosis facet DJD and nerve compression.  She had a L4-L5 interlaminar epidural injection on August 8.  She notes she is had early improvement in her symptoms.  She denies any weakness or numbness distally.  She does note continued mild low back pain but overall improved but not sufficiently improved.  She does have a initial consultation with neurosurgery scheduled for the end of this month.  Additionally she like to modify her FMLA form to have 1 to 3 episodes per month lasting 1 to 3 days modified from 1-7 and 1-7 based on feedback from her employer.  She thinks that 1-3 and 1-3 will be sufficient.   ROS as above:  Exam:  BP (!) 158/93   Pulse 85   Ht 5\' 4"  (1.626 m)   Wt 157 lb (71.2 kg)   BMI 26.95 kg/m  Gen: Well NAD L-spine: Nontender to midline.  Normal flexion decreased extension.  Lower extremity strength is intact.  Lab and Radiology Results FLUOROSCOPY TIME:  Radiation Exposure Index (as provided by the fluoroscopic device): 8.71 microGray*m^2  Fluoroscopy Time (in minutes and seconds):  3 seconds  PROCEDURE: The procedure, risks, benefits, and alternatives were explained to the patient. Questions regarding the procedure were encouraged and answered. The patient understands and consents to the procedure.  LUMBAR EPIDURAL INJECTION:  An interlaminar approach was performed on the left at L4-5 targeting the midline. The overlying skin was cleansed and anesthetized. A 3.5 inch 20 gauge epidural needle was advanced using loss-of-resistance technique.  DIAGNOSTIC EPIDURAL INJECTION:  Injection of Isovue-M 200 shows a good  epidural pattern with spread above and below the level of needle placement in the midline/bilaterally. No vascular opacification is seen.  THERAPEUTIC EPIDURAL INJECTION:  120 mg of Depo-Medrol mixed with 3 mL of 1% lidocaine were instilled. The procedure was well-tolerated, and the patient was discharged thirty minutes following the injection in good condition.  COMPLICATIONS: None  IMPRESSION: Technically successful lumbar interlaminar epidural injection at L4-5.   Electronically Signed   By: Logan Bores M.D.   On: 04/01/2018 16:00 I personally (independently) visualized and performed the interpretation of the images attached in this note.    Assessment and Plan: 47 y.o. female with  Spondylolisthesis.  Improving but not sufficiently.  Plan to go ahead and order a repeat epidural steroid injection to be done in 2 to 3 weeks. Continue home exercise program.  Do recommend following up with neurosurgery for initial consultation and opinion.  FMLA form modified via letter.  I spent 15 minutes with this patient, greater than 50% was face-to-face time counseling regarding ddx and plan.  Orders Placed This Encounter  Procedures  . DG Epidurography    Order Specific Question:   Reason for Exam (SYMPTOM  OR DIAGNOSIS REQUIRED)    Answer:   lumbar interlaminar epidural injection at L4-5 as per prior    Order Specific Question:   Is the patient pregnant?    Answer:   No    Order Specific Question:   Preferred imaging location?    Answer:   [id number]   No orders of the defined types were placed in this encounter.    Historical information moved to improve visibility  of documentation.  Past Medical History:  Diagnosis Date  . Hypertension   . Spondylolisthesis of lumbar region 03/05/2018   Past Surgical History:  Procedure Laterality Date  . TAH RSO 2012 for uterine fibroids     Social History   Tobacco Use  . Smoking status: Never Smoker  . Smokeless  tobacco: Never Used  Substance Use Topics  . Alcohol use: Yes    Alcohol/week: 0.0 standard drinks   family history includes Breast cancer (age of onset: 49) in her mother; Hypertension in her brother, daughter, father, maternal aunt, maternal grandfather, maternal grandmother, maternal uncle, mother, paternal aunt, paternal grandfather, paternal grandmother, paternal uncle, sister, and son.  Medications: Current Outpatient Medications  Medication Sig Dispense Refill  . albuterol (PROVENTIL HFA;VENTOLIN HFA) 108 (90 Base) MCG/ACT inhaler Inhale 2 puffs into the lungs every 6 (six) hours as needed for wheezing or shortness of breath. 1 Inhaler 1  . amLODipine (NORVASC) 10 MG tablet TAKE 1 TABLET BY MOUTH ONCE DAILY . 90 tablet 1  . carvedilol (COREG) 6.25 MG tablet Take 1 tablet (6.25 mg total) by mouth 2 (two) times daily with a meal. 60 tablet 1  . cyclobenzaprine (FLEXERIL) 5 MG tablet Take 1 tablet (5 mg total) by mouth 3 (three) times daily as needed for muscle spasms. 90 tablet 1  . fluticasone furoate-vilanterol (BREO ELLIPTA) 200-25 MCG/INH AEPB Inhale 1 puff into the lungs daily. 30 each 3  . ipratropium (ATROVENT) 0.06 % nasal spray Place 2 sprays into both nostrils every 4 (four) hours as needed for rhinitis. 10 mL 6  . irbesartan-hydrochlorothiazide (AVALIDE) 150-12.5 MG tablet TAKE 1 TABLET BY MOUTH DAILY 30 tablet 2  . omeprazole (PRILOSEC) 40 MG capsule Take 1 capsule (40 mg total) by mouth daily. 30 capsule 0  . traMADol (ULTRAM) 50 MG tablet Take 1 tablet (50 mg total) by mouth every 8 (eight) hours as needed. 15 tablet 0  . traZODone (DESYREL) 50 MG tablet Take 0.5-1 tablets (25-50 mg total) by mouth at bedtime as needed for sleep. 30 tablet 3   No current facility-administered medications for this visit.    Allergies  Allergen Reactions  . Ace Inhibitors Cough    Cough     Discussed warning signs or symptoms. Please see discharge instructions. Patient expresses  understanding.

## 2018-05-07 ENCOUNTER — Ambulatory Visit
Admission: RE | Admit: 2018-05-07 | Discharge: 2018-05-07 | Disposition: A | Discharge status: Home / Self Care | Payer: BLUE CROSS/BLUE SHIELD | Source: Ambulatory Visit | Attending: Family Medicine | Admitting: Family Medicine

## 2018-05-07 ENCOUNTER — Other Ambulatory Visit: Payer: BLUE CROSS/BLUE SHIELD

## 2018-05-07 MED ORDER — METHYLPREDNISOLONE ACETATE 40 MG/ML INJ SUSP (RADIOLOG
120.0000 mg | Freq: Once | INTRAMUSCULAR | Status: AC
  Administered 2018-05-07: 120 mg via EPIDURAL

## 2018-05-07 MED ORDER — IOPAMIDOL (ISOVUE-M 200) INJECTION 41%
1.0000 mL | Freq: Once | INTRAMUSCULAR | Status: AC
  Administered 2018-05-07: 1 mL via EPIDURAL

## 2018-05-07 NOTE — Discharge Instructions (Signed)

## 2018-07-05 ENCOUNTER — Encounter: Payer: Self-pay | Admitting: Family Medicine

## 2018-07-05 ENCOUNTER — Ambulatory Visit (INDEPENDENT_AMBULATORY_CARE_PROVIDER_SITE_OTHER): Payer: BLUE CROSS/BLUE SHIELD | Admitting: Family Medicine

## 2018-07-05 VITALS — BP 146/91 | HR 84 | Temp 98.0°F | Ht 64.0 in | Wt 160.0 lb

## 2018-07-05 DIAGNOSIS — R05 Cough: Secondary | ICD-10-CM

## 2018-07-05 DIAGNOSIS — R059 Cough, unspecified: Secondary | ICD-10-CM

## 2018-07-05 MED ORDER — BENZONATATE 200 MG PO CAPS: 200.0000 mg | ORAL_CAPSULE | Freq: Three times a day (TID) | ORAL | 0 refills | Status: DC | PRN

## 2018-07-05 MED ORDER — AZITHROMYCIN 250 MG PO TABS: 250.0000 mg | ORAL_TABLET | Freq: Every day | ORAL | 0 refills | Status: DC

## 2018-07-05 MED ORDER — PREDNISONE 10 MG PO TABS: 30.0000 mg | ORAL_TABLET | Freq: Every day | ORAL | 0 refills | Status: DC

## 2018-07-05 MED ORDER — GUAIFENESIN-CODEINE 100-10 MG/5ML PO SOLN: 5.0000 mL | Freq: Four times a day (QID) | ORAL | 0 refills | Status: DC | PRN

## 2018-07-05 NOTE — Progress Notes (Signed)
Lindsay George is a 47 y.o. female who presents to [facility] today for cough congestion runny nose wheezing shortness of breath.  Symptoms present for a few days now.  Patient is tried over-the-counter medications which helped a little.  Additionally she is tried some leftover Tessalon Perles which helped as well.  She works in a call center for [employer] and notes that she is had frequent episodes of cough and interfere with her work.  She is been using an old leftover albuterol inhaler which helps temporarily.  She notes her symptoms are consistent with prior episodes of bronchitis   ROS as above:  Exam:  BP (!) 146/91   Pulse 84   Temp 98 F (36.7 C) (Oral)   Ht 5\' 4"  (1.626 m)   Wt 160 lb (72.6 kg)   SpO2 98%   BMI 27.46 kg/m  Wt Readings from Last 5 Encounters:  07/05/18 160 lb (72.6 kg)  04/07/18 157 lb (71.2 kg)  03/25/18 158 lb (71.7 kg)  03/04/18 158 lb (71.7 kg)  11/09/17 152 lb (68.9 kg)    Gen: Well NAD HEENT: EOMI,  MMM clear nasal discharge.  Posterior pharynx normal-appearing tympanic membranes.  Mild cervical lymphadenopathy. Lungs: Normal work of breathing.  Coarse breath sounds and wheezing bilaterally. Heart: RRR no MRG Abd: NABS, Soft. Nondistended, Nontender Exts: Brisk capillary refill, warm and well perfused.   Lab and Radiology Results No results found for this or any previous visit (from the past 72 hour(s)). No results found.    Assessment and Plan: 47 y.o. female with bronchitis versus COPD exacerbation.  Treatment with prednisone azithromycin albuterol Tessalon Perles and codeine cough syrup.  Recheck if not improving.  Return sooner if needed.  Work note provided.   No orders of the defined types were placed in this encounter.  Meds ordered this encounter  Medications  . predniSONE (DELTASONE) 10 MG tablet   Sig: Take 3 tablets (30 mg total) by mouth daily with breakfast.    Dispense:  15 tablet    Refill:  0  . benzonatate (TESSALON) 200 MG capsule    Sig: Take 1 capsule (200 mg total) by mouth 3 (three) times daily as needed for cough.    Dispense:  45 capsule    Refill:  0  . guaiFENesin-codeine 100-10 MG/5ML syrup    Sig: Take 5 mLs by mouth every 6 (six) hours as needed.    Dispense:  120 mL    Refill:  0  . azithromycin (ZITHROMAX) 250 MG tablet    Sig: Take 1 tablet (250 mg total) by mouth daily. Take first 2 tablets together, then 1 every day until finished.    Dispense:  6 tablet    Refill:  0     Historical information moved to improve visibility of documentation.  Past Medical History:  Diagnosis Date  . Hypertension   . Spondylolisthesis of lumbar region 03/05/2018   Past Surgical History:  Procedure Laterality Date  . TAH RSO 2012 for uterine fibroids     Social History   Tobacco Use  . Smoking status: Never Smoker  . Smokeless tobacco: Never Used  Substance Use Topics  . Alcohol use: Yes    Alcohol/week: 0.0 standard drinks   family history includes Breast cancer (age of onset: 40) in her mother; Hypertension in her brother, daughter, father, maternal aunt, maternal grandfather, maternal grandmother, maternal uncle, mother, paternal aunt,  paternal grandfather, paternal grandmother, paternal uncle, sister, and son.  Medications: Current Outpatient Medications  Medication Sig Dispense Refill  . albuterol (PROVENTIL HFA;VENTOLIN HFA) 108 (90 Base) MCG/ACT inhaler Inhale 2 puffs into the lungs every 6 (six) hours as needed for wheezing or shortness of breath. 1 Inhaler 1  . amLODipine (NORVASC) 10 MG tablet TAKE 1 TABLET BY MOUTH ONCE DAILY . 90 tablet 1  . carvedilol (COREG) 6.25 MG tablet Take 1 tablet (6.25 mg total) by mouth 2 (two) times daily with a meal. 60 tablet 1  . cyclobenzaprine (FLEXERIL) 5 MG tablet Take 1 tablet (5 mg total) by mouth 3 (three) times  daily as needed for muscle spasms. 90 tablet 1  . fluticasone furoate-vilanterol (BREO ELLIPTA) 200-25 MCG/INH AEPB Inhale 1 puff into the lungs daily. 30 each 3  . ipratropium (ATROVENT) 0.06 % nasal spray Place 2 sprays into both nostrils every 4 (four) hours as needed for rhinitis. 10 mL 6  . irbesartan-hydrochlorothiazide (AVALIDE) 150-12.5 MG tablet TAKE 1 TABLET BY MOUTH DAILY 30 tablet 2  . omeprazole (PRILOSEC) 40 MG capsule Take 1 capsule (40 mg total) by mouth daily. 30 capsule 0  . traMADol (ULTRAM) 50 MG tablet Take 1 tablet (50 mg total) by mouth every 8 (eight) hours as needed. 15 tablet 0  . traZODone (DESYREL) 50 MG tablet Take 0.5-1 tablets (25-50 mg total) by mouth at bedtime as needed for sleep. 30 tablet 3  . azithromycin (ZITHROMAX) 250 MG tablet Take 1 tablet (250 mg total) by mouth daily. Take first 2 tablets together, then 1 every day until finished. 6 tablet 0  . benzonatate (TESSALON) 200 MG capsule Take 1 capsule (200 mg total) by mouth 3 (three) times daily as needed for cough. 45 capsule 0  . guaiFENesin-codeine 100-10 MG/5ML syrup Take 5 mLs by mouth every 6 (six) hours as needed. 120 mL 0  . predniSONE (DELTASONE) 10 MG tablet Take 3 tablets (30 mg total) by mouth daily with breakfast. 15 tablet 0   No current facility-administered medications for this visit.    Allergies  Allergen Reactions  . Ace Inhibitors Cough    Cough     Discussed warning signs or symptoms. Please see discharge instructions. Patient expresses understanding.

## 2018-07-05 NOTE — Patient Instructions (Addendum)
Thank you for coming in today. Take prednisone daily.  Take azithromycin  Use tessalon pearles for cough as needed.  Use codeine cough medicine mostly at bedtime as needed.  Call or go to the emergency room if you get worse, have trouble breathing, have chest pains, or palpitations.     Acute Bronchitis, Adult Acute bronchitis is when air tubes (bronchi) in the lungs suddenly get swollen. The condition can make it hard to breathe. It can also cause these symptoms:  A cough.  Coughing up clear, yellow, or green mucus.  Wheezing.  Chest congestion.  Shortness of breath.  A fever.  Body aches.  Chills.  A sore throat.  Follow these instructions at home: Medicines  Take over-the-counter and prescription medicines only as told by your doctor.  If you were prescribed an antibiotic medicine, take it as told by your doctor. Do not stop taking the antibiotic even if you start to feel better. General instructions  Rest.  Drink enough fluids to keep your pee (urine) clear or pale yellow.  Avoid smoking and secondhand smoke. If you smoke and you need help quitting, ask your doctor. Quitting will help your lungs heal faster.  Use an inhaler, cool mist vaporizer, or humidifier as told by your doctor.  Keep all follow-up visits as told by your doctor. This is important. How is this prevented? To lower your risk of getting this condition again:  Wash your hands often with soap and water. If you cannot use soap and water, use hand sanitizer.  Avoid contact with people who have cold symptoms.  Try not to touch your hands to your mouth, nose, or eyes.  Make sure to get the flu shot every year.  Contact a doctor if:  Your symptoms do not get better in 2 weeks. Get help right away if:  You cough up blood.  You have chest pain.  You have very bad shortness of breath.  You become dehydrated.  You faint (pass out) or keep feeling like you are going to pass out.  You  keep throwing up (vomiting).  You have a very bad headache.  Your fever or chills gets worse. This information is not intended to replace advice given to you by your health care provider. Make sure you discuss any questions you have with your health care provider. Document Released: 01/28/2008 Document Revised: 03/19/2016 Document Reviewed: 01/30/2016 Elsevier Interactive Patient Education  [employer].

## 2018-08-19 ENCOUNTER — Other Ambulatory Visit: Payer: Self-pay | Admitting: Family Medicine

## 2018-08-19 DIAGNOSIS — I10 Essential (primary) hypertension: Secondary | ICD-10-CM

## 2018-08-25 IMAGING — MR MR LUMBAR SPINE W/O CM
4 of 5 series · 26 of 48 positions shown · non-contrast
Comparison: Lumbar spine radiographs 04/19/2015

CLINICAL DATA: Spondylolisthesis lumbar spine. Bilateral L5
radicular symptoms.

EXAM:
MRI LUMBAR SPINE WITHOUT CONTRAST
TECHNIQUE: Multiplanar, multisequence MR imaging of the lumbar spine was
performed. No intravenous contrast was administered.

[Series 2: T2 · sagittal · 4.0mm · 0.81mm/px · 6 of 15 slices shown (1 of 2)]
[im 1/15]
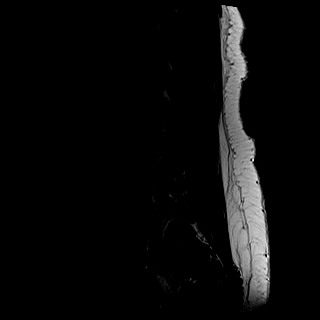
[im 3/15]
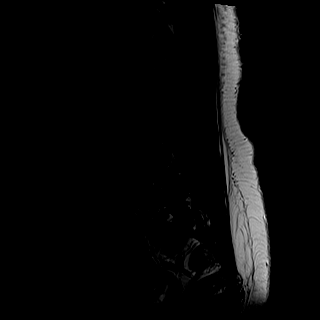
[im 6/15]
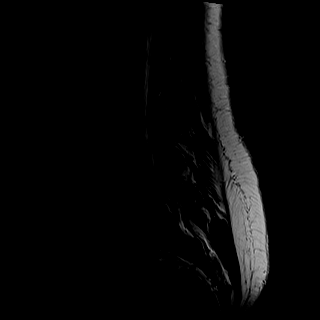
[im 9/15]
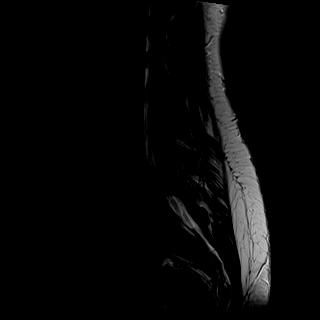
[im 12/15]
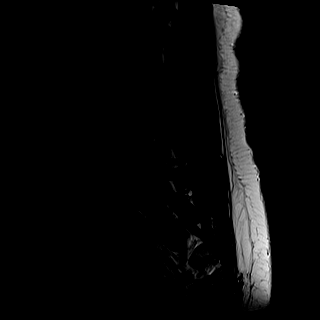
[im 15/15]
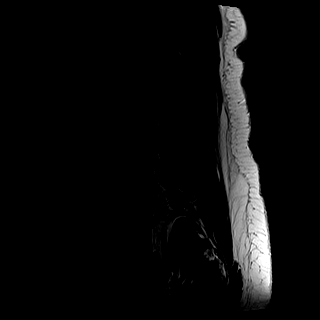

[Series 3: T1 · sagittal · 4.0mm · 0.41mm/px · 6 of 15 slices shown (1 of 2)]
[im 1/15]
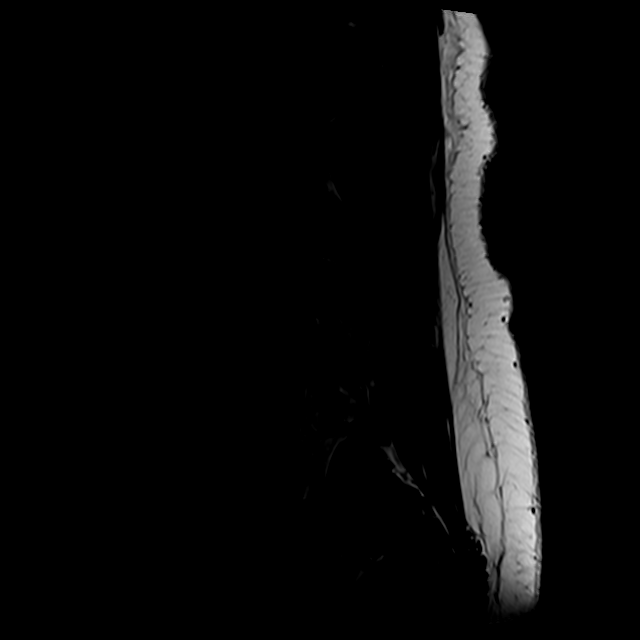
[im 3/15]
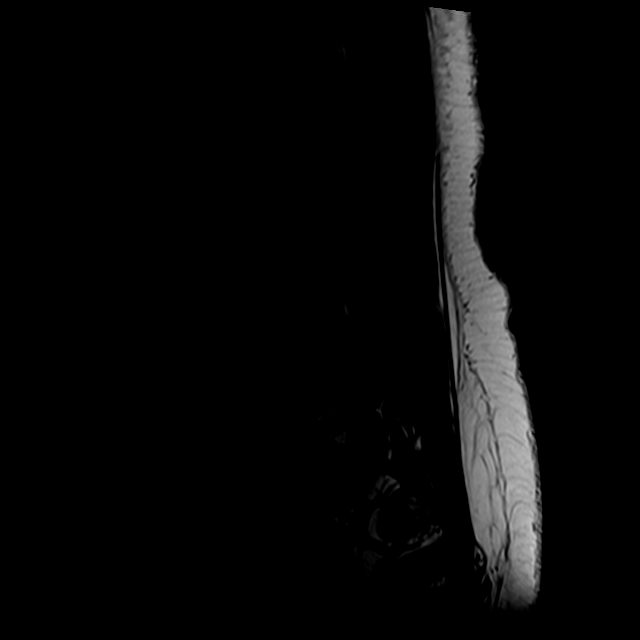
[im 6/15]
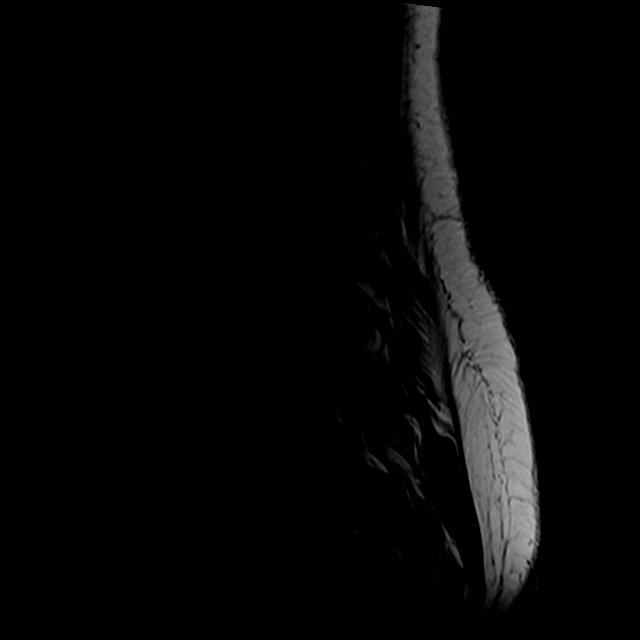
[im 9/15]
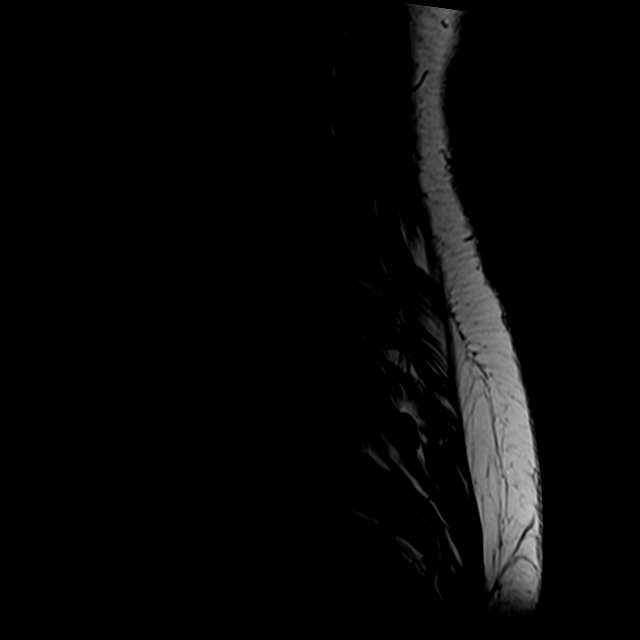
[im 12/15]
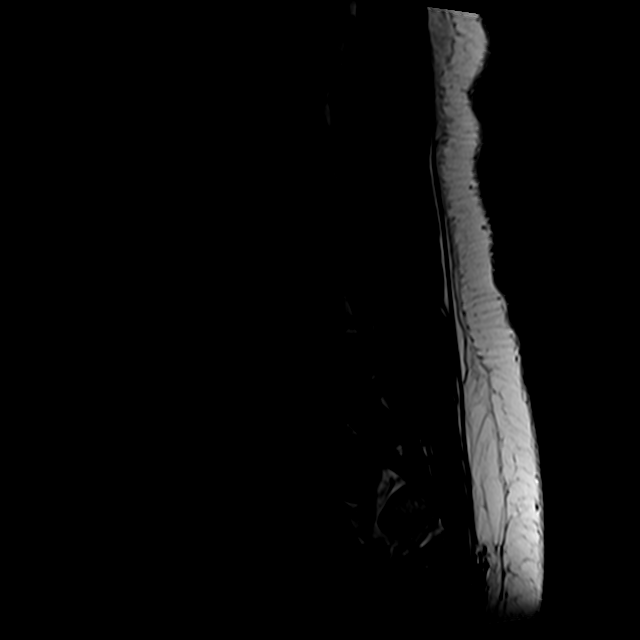
[im 15/15]
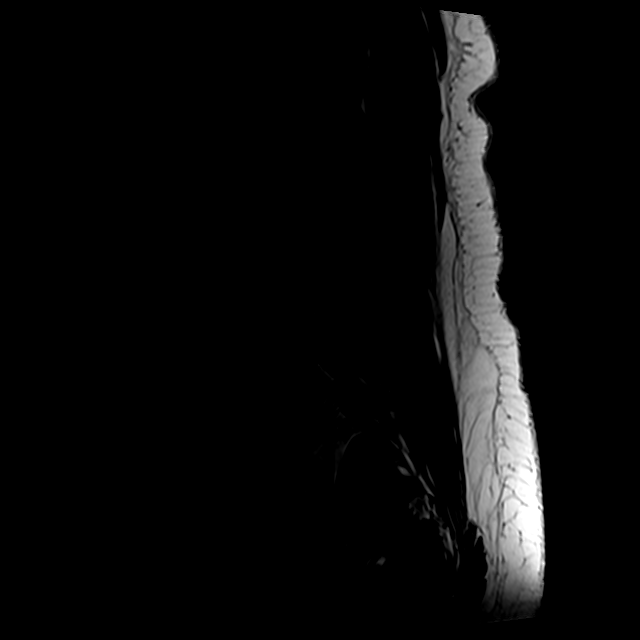

[Series 5: T2 · axial · 4.0mm · 0.78mm/px · z∈[-127,+89]mm · 9 of 37 slices shown (2 of 2)]
[im 1/37]
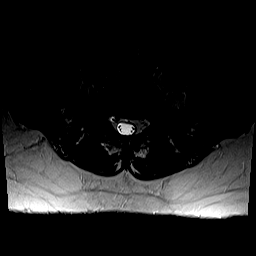
[im 6/37]
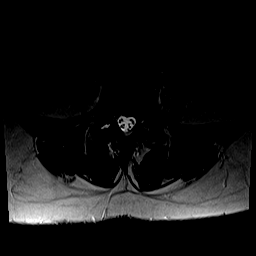
[im 11/37]
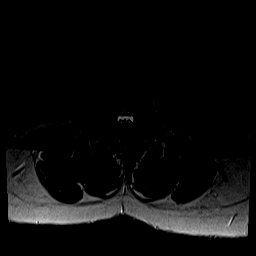
[im 16/37]
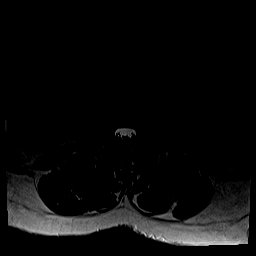
[im 19/37]
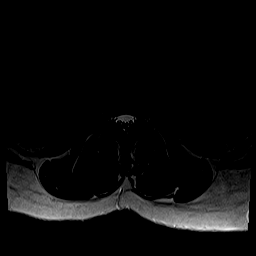
[im 21/37]
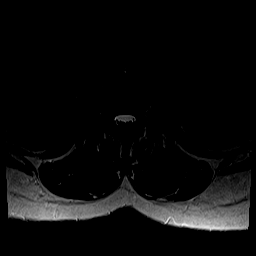
[im 26/37]
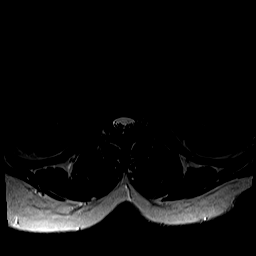
[im 31/37]
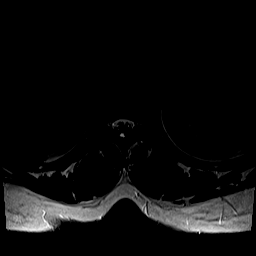
[im 37/37]
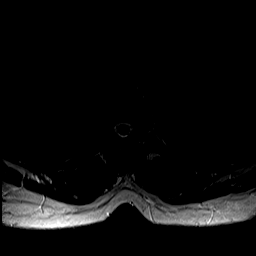

[Series 6: T1 · axial · 4.0mm · 0.39mm/px · z∈[-127,+59]mm · 5 of 37 slices shown (2 of 2)]
[im 1/37]
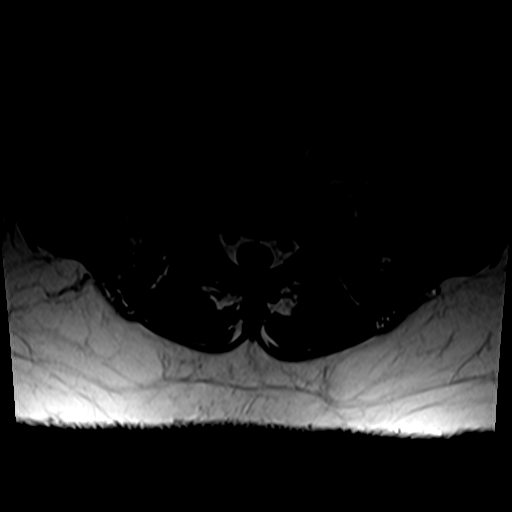
[im 6/37]
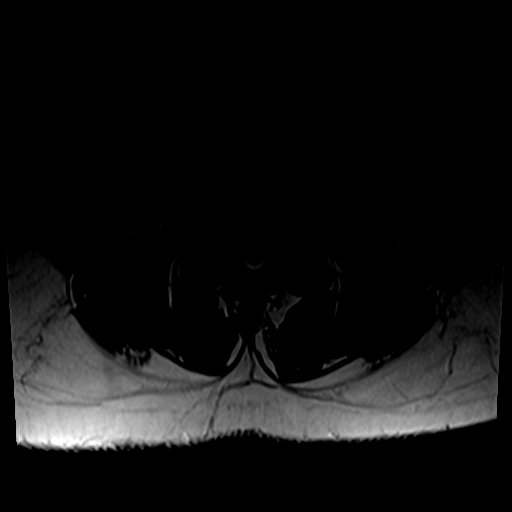
[im 11/37]
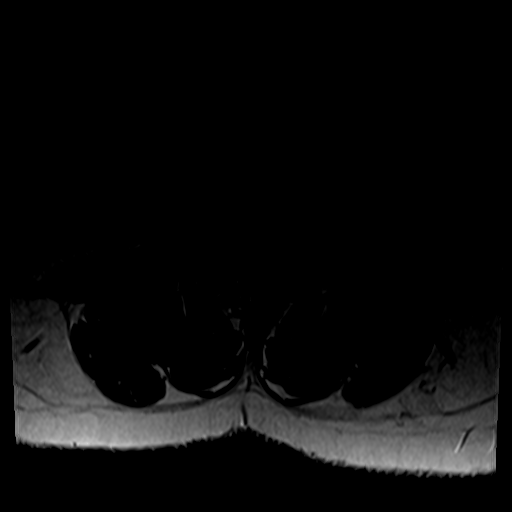
[im 19/37]
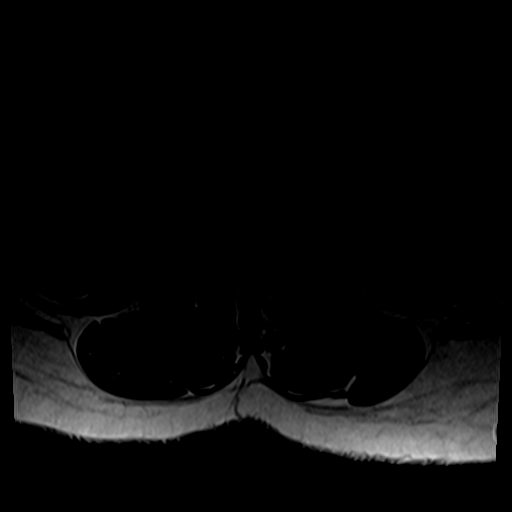
[im 31/37]
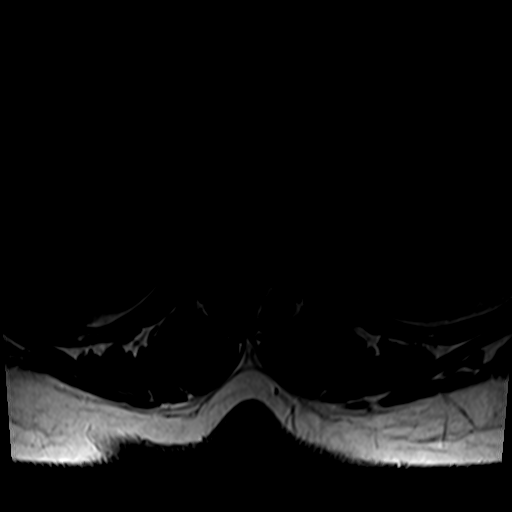

[26 of 48 positions shown; findings below may reference images not displayed]

FINDINGS: Segmentation:  Normal

Alignment:  6 mm anterolisthesis L4-5.  Remaining alignment normal

Vertebrae:  Negative for fracture or mass.  No pars defect.

Conus medullaris and cauda equina: Conus extends to the L1-2 level.
Conus and cauda equina appear normal.

Paraspinal and other soft tissues: Negative for paraspinous mass or
soft tissue edema

Disc levels:

L1-2: Mild facet degeneration.  Normal disc space.

L2-3: Normal disc space.  Mild facet degeneration

L3-4: Negative

L4-5: Severe facet degeneration with facet bony hypertrophy and
bilateral facet joint effusions. 6 mm anterolisthesis with diffuse
bulging of the disc. Moderate to severe spinal stenosis. Severe
subarticular stenosis bilaterally. Severe left foraminal
encroachment with flattening of the left L4 nerve root. Moderate
right foraminal stenosis.

L5-S1: Negative
IMPRESSION: 6 mm anterolisthesis L4-5 with severe facet degeneration. Moderate
to severe spinal stenosis. Severe subarticular stenosis bilaterally
and severe left foraminal encroachment

## 2018-09-16 ENCOUNTER — Other Ambulatory Visit: Payer: Self-pay | Admitting: Family Medicine

## 2018-10-08 ENCOUNTER — Other Ambulatory Visit: Payer: Self-pay | Admitting: Family Medicine

## 2018-10-08 DIAGNOSIS — Z1231 Encounter for screening mammogram for malignant neoplasm of breast: Secondary | ICD-10-CM

## 2018-11-04 ENCOUNTER — Ambulatory Visit: Payer: BLUE CROSS/BLUE SHIELD

## 2018-11-09 ENCOUNTER — Other Ambulatory Visit: Payer: Self-pay | Admitting: Family Medicine

## 2018-11-09 ENCOUNTER — Telehealth: Payer: Self-pay | Admitting: Family Medicine

## 2018-11-09 NOTE — Telephone Encounter (Signed)
Patient needs follow-up appointment about her blood pressure medication.  I sent in a 1 month refill.  If you cannot get in in a month please let me know I do not want to run out of this medication.

## 2018-11-09 NOTE — Telephone Encounter (Signed)
Patient does not have a follow up and has not been seen since last year. Please advise.

## 2018-11-09 NOTE — Telephone Encounter (Signed)
Attempted to contact Pt, no answer and VM is full.  

## 2018-11-19 NOTE — Telephone Encounter (Signed)
Left message for return call.

## 2018-11-25 NOTE — Telephone Encounter (Signed)
Spoke with patient and advised her she will need an appt. Before next refill. Offered to set up web/telephone visit, pt stated she would call back when she had her schedule.

## 2019-01-04 ENCOUNTER — Other Ambulatory Visit: Payer: Self-pay | Admitting: Family Medicine

## 2019-01-13 ENCOUNTER — Other Ambulatory Visit: Payer: Self-pay | Admitting: Family Medicine

## 2019-01-13 NOTE — Telephone Encounter (Signed)
Voicemail was full and could not leave information to call us back.

## 2019-01-13 NOTE — Telephone Encounter (Signed)
Please call pt to schedule for appt with Dr Georgina Snell- she has already been made aware she needed follow up.   Thanks!

## 2019-01-20 ENCOUNTER — Ambulatory Visit (INDEPENDENT_AMBULATORY_CARE_PROVIDER_SITE_OTHER): Payer: BLUE CROSS/BLUE SHIELD | Admitting: Family Medicine

## 2019-01-20 ENCOUNTER — Encounter: Payer: Self-pay | Admitting: Family Medicine

## 2019-01-20 VITALS — BP 159/92 | HR 84 | Temp 98.5°F | Ht 64.0 in

## 2019-01-20 DIAGNOSIS — J019 Acute sinusitis, unspecified: Secondary | ICD-10-CM

## 2019-01-20 DIAGNOSIS — I10 Essential (primary) hypertension: Secondary | ICD-10-CM

## 2019-01-20 MED ORDER — CARVEDILOL 6.25 MG PO TABS: 6.2500 mg | ORAL_TABLET | Freq: Two times a day (BID) | ORAL | 0 refills | Status: DC

## 2019-01-20 MED ORDER — AMOXICILLIN 875 MG PO TABS: 875.0000 mg | ORAL_TABLET | Freq: Two times a day (BID) | ORAL | 0 refills | Status: DC

## 2019-01-20 MED ORDER — IRBESARTAN-HYDROCHLOROTHIAZIDE 150-12.5 MG PO TABS: 1.0000 | ORAL_TABLET | Freq: Every day | ORAL | 0 refills | Status: DC

## 2019-01-20 NOTE — Progress Notes (Signed)
Virtual Visit via Video Note  I connected with Retta Diones on 01/20/19 at  4:20 PM EDT by a video enabled telemedicine application and verified that I am speaking with the correct person using two identifiers.   I discussed the limitations of evaluation and management by telemedicine and the availability of in person appointments. The patient expressed understanding and agreed to proceed.  Pt was at home and I was in my office for the virtual visit.      Subjective:    CC: congestion  HPI: 48 year old female with a history of hypertension complains of runnynose, nasal congestion x 6 days.  She is been using some Mucinex some over-the-counter nasal spray as well as some old leftover prednisone.  She is just not getting significant relief.  She has not had any fever that she is aware of.  No nausea or vomiting.  No sweats or chills or headaches.  No significant facial pressure.  She has had some nasal drainage with a green color. She is coughing some.  No SOB or winded. No bodyaches.  Hypertension- Pt denies chest pain, SOB, dizziness, or heart palpitations.  Taking meds as directed w/o problems.  Denies medication side effects.  She has been out of 1 of her blood pressure medications for a couple of days she says she needs a refill sent to the pharmacy.    Past medical history, Surgical history, Family history not pertinant except as noted below, Social history, Allergies, and medications have been entered into the medical record, reviewed, and corrections made.   Review of Systems: No fevers, chills, night sweats, weight loss, chest pain, or shortness of breath.   Objective:    General: Speaking clearly in complete sentences without any shortness of breath.  Alert and oriented x3.  Normal judgment. No apparent acute distress. Well groomed.     Impression and Recommendations:   Acute sinusitis -this point she is had symptoms for 6 days.  I did encourage her to give it a couple more  days to see if she starts to feel like she is getting better on her own as a suspect it is likely viral.  Her mom now has very similar symptoms.  I do not think it is COVID if she is not really had any significant chest symptoms though a little bit of mild cough.   Hypertension-uncontrolled-currently on amlodipine 10 mg carvedilol 6.25 mg and Avalide.  She thinks it is because of the cold medicine that she is taking she has been using Mucinex D which can raise blood pressure.  Encouraged her to check a couple times next week when she is off of the cold medicine and she is feeling better.  If her top number is 140 or higher than I want her to call back so that we can adjust her medication she has room to go up on her Avalide and her carvedilol if needed.  I did go ahead and refill her prescriptions for now and encouraged her to go to the lab in the next couple of weeks.      I discussed the assessment and treatment plan with the patient. The patient was provided an opportunity to ask questions and all were answered. The patient agreed with the plan and demonstrated an understanding of the instructions.   The patient was advised to call back or seek an in-person evaluation if the symptoms worsen or if the condition fails to improve as anticipated.   Beatrice Lecher, MD

## 2019-01-20 NOTE — Progress Notes (Signed)
Head and nasal congestion she has been taking mucinex, nasal spray, and she has some left over prednisone that she took also.   Checking her temp has not had any fevers No n/v/f/s/c/headaches or facial pressure. Drainage has had some greenish color to it.   Maryruth Eve, Lahoma Crocker, CMA

## 2019-01-22 LAB — COMPLETE METABOLIC PANEL WITH GFR
AG Ratio: 1.6 (calc) (ref 1.0–2.5)
ALT: 13 U/L (ref 6–29)
AST: 12 U/L (ref 10–35)
Albumin: 4.1 g/dL (ref 3.6–5.1)
Alkaline phosphatase (APISO): 100 U/L (ref 37–153)
BUN: 12 mg/dL (ref 7–25)
CO2: 29 mmol/L (ref 20–32)
Calcium: 9.6 mg/dL (ref 8.6–10.4)
Chloride: 106 mmol/L (ref 98–110)
Creat: 0.79 mg/dL (ref 0.50–1.05)
GFR, Est African American: 101 mL/min/{1.73_m2} (ref >=60)
GFR, Est Non African American: 87 mL/min/{1.73_m2} (ref >=60)
Globulin: 2.5 g/dL (calc) (ref 1.9–3.7)
Glucose, Bld: 93 mg/dL (ref 65–99)
Potassium: 3.5 mmol/L (ref 3.5–5.3)
Sodium: 143 mmol/L (ref 135–146)
Total Bilirubin: 0.4 mg/dL (ref 0.2–1.2)
Total Protein: 6.6 g/dL (ref 6.1–8.1)

## 2019-01-22 LAB — LIPID PANEL
Cholesterol: 215 mg/dL — ABNORMAL HIGH (ref <200)
HDL: 55 mg/dL (ref >=50)
LDL Cholesterol (Calc): 138 mg/dL (calc) — ABNORMAL HIGH
Non-HDL Cholesterol (Calc): 160 mg/dL (calc) — ABNORMAL HIGH (ref <130)
Total CHOL/HDL Ratio: 3.9 (calc) (ref <5.0)
Triglycerides: 107 mg/dL (ref <150)

## 2019-03-03 ENCOUNTER — Ambulatory Visit
Admission: RE | Admit: 2019-03-03 | Discharge: 2019-03-03 | Disposition: A | Discharge status: Home / Self Care | Payer: BLUE CROSS/BLUE SHIELD | Source: Ambulatory Visit | Attending: Family Medicine | Admitting: Family Medicine

## 2019-03-03 ENCOUNTER — Other Ambulatory Visit: Payer: Self-pay

## 2019-03-03 DIAGNOSIS — Z1231 Encounter for screening mammogram for malignant neoplasm of breast: Secondary | ICD-10-CM

## 2019-03-10 ENCOUNTER — Ambulatory Visit (INDEPENDENT_AMBULATORY_CARE_PROVIDER_SITE_OTHER): Payer: BC Managed Care – PPO | Admitting: Family Medicine

## 2019-03-10 ENCOUNTER — Other Ambulatory Visit: Payer: Self-pay

## 2019-03-10 ENCOUNTER — Encounter: Payer: Self-pay | Admitting: Family Medicine

## 2019-03-10 VITALS — BP 148/83 | HR 73 | Temp 98.4°F | Wt 168.0 lb

## 2019-03-10 DIAGNOSIS — Z1211 Encounter for screening for malignant neoplasm of colon: Secondary | ICD-10-CM | POA: Diagnosis not present

## 2019-03-10 DIAGNOSIS — M48061 Spinal stenosis, lumbar region without neurogenic claudication: Secondary | ICD-10-CM

## 2019-03-10 DIAGNOSIS — M47816 Spondylosis without myelopathy or radiculopathy, lumbar region: Secondary | ICD-10-CM | POA: Diagnosis not present

## 2019-03-10 DIAGNOSIS — M4316 Spondylolisthesis, lumbar region: Secondary | ICD-10-CM

## 2019-03-10 MED ORDER — ATORVASTATIN CALCIUM 20 MG PO TABS: 20.0000 mg | ORAL_TABLET | Freq: Every day | ORAL | 1 refills | Status: DC

## 2019-03-10 MED ORDER — IRBESARTAN-HYDROCHLOROTHIAZIDE 150-12.5 MG PO TABS: 2.0000 | ORAL_TABLET | Freq: Every day | ORAL | 1 refills | Status: DC

## 2019-03-10 MED ORDER — CARVEDILOL PHOSPHATE ER 20 MG PO CP24: 20.0000 mg | ORAL_CAPSULE | Freq: Every day | ORAL | 1 refills | Status: DC

## 2019-03-10 NOTE — Progress Notes (Signed)
Lindsay George is a 48 y.o. female who presents to [facility] today for moderate to severe lumbar back pain radiating down legs BL L>R.Marland Kitchen Pain is worsening. Patient reports numbness and tingling radiating down legs BL. Patient has pain everyday and the pain is affecting daily activities such as walking. Says that spine epidural steroid injection helped some but do not last very long and do not provide full relief.  She notes that she hurts every day and notes that her activities have been significantly restricted by her back and leg pain.  She denies weakness numbness or bowel bladder dysfunction.  She is ready to consider surgery.. Patient is considering surgery.  Additionally she has hypertension.  She takes Coreg 6.25 twice daily often missing the evening dose,  irbesartan/hydrochlorothiazide 150/12.5 and amlodipine 10.  She denies chest pain palpitation shortness of breath.  She has blood pressure at home is typically in the 140s or 150s.  Patient wants to schedule a colonoscopy and notes a FH of colon cancer in her aunt.  ROS:  As above  Exam:  BP (!) 148/83    Pulse 73    Temp 98.4 F (36.9 C) (Oral)    Wt 168 lb (76.2 kg)    BMI 28.84 kg/m   Wt Readings from Last 5 Encounters:  03/10/19 168 lb (76.2 kg)  07/05/18 160 lb (72.6 kg)  04/07/18 157 lb (71.2 kg)  03/25/18 158 lb (71.7 kg)  03/04/18 158 lb (71.7 kg)   General: Well Developed, well nourished, and in no acute distress.  Neuro/Psych: Alert and oriented x3, extra-ocular muscles intact, able to move all 4 extremities, sensation grossly intact. 2+ patellar and achilles reflexes BL. Skin: Warm and dry, no rashes noted.  Respiratory: Not using accessory muscles, speaking in full sentences, trachea midline.  Cardiovascular: Pulses palpable, no extremity edema. Abdomen: Does not appear distended. MSK: Lspine: Normal appearance. Tender to palpation along midline and paraspinals.  Pain and stiffness with forward and lateral flexion, extension, and rotation.    Lab and Radiology Results EXAM: MRI LUMBAR SPINE WITHOUT CONTRAST  TECHNIQUE: Multiplanar, multisequence MR imaging of the lumbar spine was performed. No intravenous contrast was administered.  COMPARISON:  Lumbar spine radiographs 04/19/2015  FINDINGS: Segmentation:  Normal  Alignment:  6 mm anterolisthesis L4-5.  Remaining alignment normal  Vertebrae:  Negative for fracture or mass.  No pars defect.  Conus medullaris and cauda equina: Conus extends to the L1-2 level. Conus and cauda equina appear normal.  Paraspinal and other soft tissues: Negative for paraspinous mass or soft tissue edema  Disc levels:  L1-2: Mild facet degeneration.  Normal disc space.  L2-3: Normal disc space.  Mild facet degeneration  L3-4: Negative  L4-5: Severe facet degeneration with facet bony hypertrophy and bilateral facet joint effusions. 6 mm anterolisthesis with diffuse bulging of the disc. Moderate to severe spinal stenosis. Severe subarticular stenosis bilaterally. Severe left foraminal encroachment with flattening of the left L4 nerve root. Moderate right foraminal stenosis.  L5-S1: Negative  IMPRESSION: 6 mm anterolisthesis L4-5 with severe facet degeneration. Moderate to severe spinal stenosis. Severe subarticular stenosis bilaterally and severe left foraminal encroachment   Electronically Signed   By: Franchot Gallo M.D.   On: 03/22/2018 13:37 I personally (independently) visualized and performed the interpretation of the images attached in this note.   Recent Results (from the past 2160 hour(s))  Lipid panel     Status: Abnormal   Collection Time: 01/21/19  12:18 PM  Result Value Ref Range   Cholesterol 215 (H) <200 mg/dL   HDL 55 > OR = 50 mg/dL   Triglycerides 107 <150 mg/dL   LDL Cholesterol (Calc) 138 (H) mg/dL (calc)    Comment: Reference range: <100 . Desirable  range <100 mg/dL for primary prevention;   <70 mg/dL for patients with CHD or diabetic patients  with > or = 2 CHD risk factors. Marland Kitchen LDL-C is now calculated using the Martin-Hopkins  calculation, which is a validated novel method providing  better accuracy than the Friedewald equation in the  estimation of LDL-C.  Cresenciano Genre et al. Annamaria Helling. 7124;580(99): 2061-2068  (http://education.[facility].com/faq/FAQ164)    Total CHOL/HDL Ratio 3.9 <5.0 (calc)   Non-HDL Cholesterol (Calc) 160 (H) <130 mg/dL (calc)    Comment: For patients with diabetes plus 1 major ASCVD risk  factor, treating to a non-HDL-C goal of <100 mg/dL  (LDL-C of <70 mg/dL) is considered a therapeutic  option.   COMPLETE METABOLIC PANEL WITH GFR     Status: None   Collection Time: 01/21/19 12:18 PM  Result Value Ref Range   Glucose, Bld 93 65 - 99 mg/dL    Comment: .            Fasting reference interval .    BUN 12 7 - 25 mg/dL   Creat 0.79 0.50 - 1.05 mg/dL    Comment: For patients >36 years of age, the reference limit for Creatinine is approximately 13% higher for people identified as African-American. .    GFR, Est Non African American 87 > OR = 60 mL/min/1.59m2   GFR, Est African American 101 > OR = 60 mL/min/1.21m2   BUN/Creatinine Ratio NOT APPLICABLE 6 - 22 (calc)   Sodium 143 135 - 146 mmol/L   Potassium 3.5 3.5 - 5.3 mmol/L   Chloride 106 98 - 110 mmol/L   CO2 29 20 - 32 mmol/L   Calcium 9.6 8.6 - 10.4 mg/dL   Total Protein 6.6 6.1 - 8.1 g/dL   Albumin 4.1 3.6 - 5.1 g/dL   Globulin 2.5 1.9 - 3.7 g/dL (calc)   AG Ratio 1.6 1.0 - 2.5 (calc)   Total Bilirubin 0.4 0.2 - 1.2 mg/dL   Alkaline phosphatase (APISO) 100 37 - 153 U/L   AST 12 10 - 35 U/L   ALT 13 6 - 29 U/L      Assessment and Plan: 48 y.o. female with Hx of lumbar spondylolisthesis and facet arthritis is presenting with worsening lower back pain radiating down both legs. Pain is interfering with patient's quality of life. Referring  to orthopedic surgery for back surgery consult. Also ordered ESI.   Patient reports that home BP readings are even higher than today's elevated reading. Switching Coreg to Coreg CR 20. Increase Irbesartan/HCTZ to 300/25 daily.  Referred to Gastroenterology for screening colonoscopy.  Starting Atorvastatin due to elevated LDLs.      PDMP not reviewed this encounter. Orders Placed This Encounter  Procedures   DG Epidurography    Order Specific Question:   Reason for Exam (SYMPTOM  OR DIAGNOSIS REQUIRED)    Answer:   Repeat injection l lumbar interlaminar epidural injection on the left at L4-5.    Order Specific Question:   Is the patient pregnant?    Answer:   No    Order Specific Question:   Preferred imaging location?    Answer:   [id number]   Ambulatory referral to Gastroenterology  Referral Priority:   Routine    Referral Type:   Consultation    Referral Reason:   Specialty Services Required    Referred to Provider:   Lavena Bullion, DO    Number of Visits Requested:   1   Ambulatory referral to Orthopedic Surgery    Referral Priority:   Routine    Referral Type:   Surgical    Referral Reason:   Specialty Services Required    Referred to Provider:   Phylliss Bob, MD    Requested Specialty:   Orthopedic Surgery    Number of Visits Requested:   1   Meds ordered this encounter  Medications   carvedilol (COREG CR) 20 MG 24 hr capsule    Sig: Take 1 capsule (20 mg total) by mouth daily.    Dispense:  90 capsule    Refill:  1    Replaces coreg   irbesartan-hydrochlorothiazide (AVALIDE) 150-12.5 MG tablet    Sig: Take 2 tablets by mouth daily.    Dispense:  180 tablet    Refill:  1   atorvastatin (LIPITOR) 20 MG tablet    Sig: Take 1 tablet (20 mg total) by mouth daily.    Dispense:  90 tablet    Refill:  1    Historical information moved to improve visibility of documentation.  Past Medical History:  Diagnosis Date   Hypertension     Spondylolisthesis of lumbar region 03/05/2018   Past Surgical History:  Procedure Laterality Date   TAH RSO 2012 for uterine fibroids     Social History   Tobacco Use   Smoking status: Never Smoker   Smokeless tobacco: Never Used  Substance Use Topics   Alcohol use: Yes    Alcohol/week: 0.0 standard drinks   family history includes Breast cancer (age of onset: 34) in her mother; Hypertension in her brother, daughter, father, maternal aunt, maternal grandfather, maternal grandmother, maternal uncle, mother, paternal aunt, paternal grandfather, paternal grandmother, paternal uncle, sister, and son.  Medications: Current Outpatient Medications  Medication Sig Dispense Refill   amLODipine (NORVASC) 10 MG tablet TAKE 1 TABLET BY MOUTH ONCE DAILY 90 tablet 0   atorvastatin (LIPITOR) 20 MG tablet Take 1 tablet (20 mg total) by mouth daily. 90 tablet 1   carvedilol (COREG CR) 20 MG 24 hr capsule Take 1 capsule (20 mg total) by mouth daily. 90 capsule 1   irbesartan-hydrochlorothiazide (AVALIDE) 150-12.5 MG tablet Take 2 tablets by mouth daily. 180 tablet 1   No current facility-administered medications for this visit.    Allergies  Allergen Reactions   Ace Inhibitors Cough    Cough      Discussed warning signs or symptoms. Please see discharge instructions. . Patient expresses understanding.  I personally was present and performed or re-performed the history, physical exam and medical decision-making activities of this service and have verified that the service and findings are accurately documented in the student's note. ___________________________________________ Lynne Leader M.D., ABFM., CAQSM. Primary Care and Sports Medicine Adjunct Instructor of [employer] [facility] [facility] [employer]

## 2019-03-10 NOTE — Progress Notes (Signed)
Note duplication 

## 2019-03-10 NOTE — Patient Instructions (Addendum)
Thank you for coming in today.  You should hear about back injection.  You should also hear from [facility] about consultation for back surgeyr.  For blood pressure.  CONTINUE amlodipine.  Increase irbesartan/HCTZ to 2 pills once daily.  STOP Coreg 2x daily.  START Coreg CR 20mg  daily.   START atorvistatin for cholesterol.   Return in 2 month for blood pressure recheck and re-do labs.   You should also hear about gastroenterology referral.    Atorvastatin tablets What is this medicine? ATORVASTATIN (a TORE va sta tin) is known as a HMG-CoA reductase inhibitor or 'statin'. It lowers the level of cholesterol and triglycerides in the blood. This drug may also reduce the risk of heart attack, stroke, or other health problems in patients with risk factors for heart disease. Diet and lifestyle changes are often used with this drug. This medicine may be used for other purposes; ask your health care provider or pharmacist if you have questions. COMMON BRAND NAME(S): Lipitor What should I tell my health care provider before I take this medicine? They need to know if you have any of these conditions:  diabetes  if you often drink alcohol  history of stroke  kidney disease  liver disease  muscle aches or weakness  thyroid disease  an unusual or allergic reaction to atorvastatin, other medicines, foods, dyes, or preservatives  pregnant or trying to get pregnant  breast-feeding How should I use this medicine? Take this medicine by mouth with a glass of water. Follow the directions on the prescription label. You can take it with or without food. If it upsets your stomach, take it with food. Do not take with grapefruit juice. Take your medicine at regular intervals. Do not take it more often than directed. Do not stop taking except on your doctor's advice. Talk to your pediatrician regarding the use of this medicine in children. While this drug may be prescribed  for children as young as 10 for selected conditions, precautions do apply. Overdosage: If you think you have taken too much of this medicine contact a poison control center or emergency room at once. NOTE: This medicine is only for you. Do not share this medicine with others. What if I miss a dose? If you miss a dose, take it as soon as you can. If your next dose is to be taken in less than 12 hours, then do not take the missed dose. Take the next dose at your regular time. Do not take double or extra doses. What may interact with this medicine? Do not take this medicine with any of the following medications:  dasabuvir; ombitasvir; paritaprevir; ritonavir  ombitasvir; paritaprevir; ritonavir  posaconazole  red yeast rice This medicine may also interact with the following medications:  alcohol  birth control pills  certain antibiotics like erythromycin and clarithromycin  certain antivirals for HIV or hepatitis  certain medicines for cholesterol like fenofibrate, gemfibrozil, and niacin  certain medicines for fungal infections like ketoconazole and itraconazole  colchicine  cyclosporine  digoxin  grapefruit juice  rifampin This list may not describe all possible interactions. Give your health care provider a list of all the medicines, herbs, non-prescription drugs, or dietary supplements you use. Also tell them if you smoke, drink alcohol, or use illegal drugs. Some items may interact with your medicine. What should I watch for while using this medicine? Visit your doctor or health care professional for regular check-ups. You may need regular tests to make sure  your liver is working properly. Your health care professional may tell you to stop taking this medicine if you develop muscle problems. If your muscle problems do not go away after stopping this medicine, contact your health care professional. Do not become pregnant while taking this medicine. Women should inform  their health care professional if they wish to become pregnant or think they might be pregnant. There is a potential for serious side effects to an unborn child. Talk to your health care professional or pharmacist for more information. Do not breast-feed an infant while taking this medicine. This medicine may increase blood sugar. Ask your healthcare provider if changes in diet or medicines are needed if you have diabetes. If you are going to need surgery or other procedure, tell your doctor that you are using this medicine. This drug is only part of a total heart-health program. Your doctor or a dietician can suggest a low-cholesterol and low-fat diet to help. Avoid alcohol and smoking, and keep a proper exercise schedule. This medicine may cause a decrease in Co-Enzyme Q-10. You should make sure that you get enough Co-Enzyme Q-10 while you are taking this medicine. Discuss the foods you eat and the vitamins you take with your health care professional. What side effects may I notice from receiving this medicine? Side effects that you should report to your doctor or health care professional as soon as possible:  allergic reactions like skin rash, itching or hives, swelling of the face, lips, or tongue  fever  joint pain  loss of memory  redness, blistering, peeling or loosening of the skin, including inside the mouth  signs and symptoms of high blood sugar such as being more thirsty or hungry or having to urinate more than normal. You may also feel very tired or have blurry vision.  signs and symptoms of liver injury like dark yellow or brown urine; general ill feeling or flu-like symptoms; light-belly pain; unusually weak or tired; yellowing of the eyes or skin  signs and symptoms of muscle injury like dark urine; trouble passing urine or change in the amount of urine; unusually weak or tired; muscle pain or side or back pain Side effects that usually do not require medical attention (report  to your doctor or health care professional if they continue or are bothersome):  diarrhea  nausea  stomach pain  trouble sleeping  upset stomach This list may not describe all possible side effects. Call your doctor for medical advice about side effects. You may report side effects to FDA at 1-800-FDA-1088. Where should I keep my medicine? Keep out of the reach of children. Store between 20 and 25 degrees C (68 and 77 degrees F). Throw away any unused medicine after the expiration date. NOTE: This sheet is a summary. It may not cover all possible information. If you have questions about this medicine, talk to your doctor, pharmacist, or health care provider.  2020 Elsevier/Gold Standard (2018-06-02 11:36:16)

## 2019-03-11 ENCOUNTER — Other Ambulatory Visit: Payer: Self-pay | Admitting: Family Medicine

## 2019-03-11 DIAGNOSIS — I10 Essential (primary) hypertension: Secondary | ICD-10-CM

## 2019-03-16 ENCOUNTER — Encounter: Payer: Self-pay | Admitting: Gastroenterology

## 2019-03-22 ENCOUNTER — Ambulatory Visit: Payer: BC Managed Care – PPO | Admitting: *Deleted

## 2019-03-22 ENCOUNTER — Other Ambulatory Visit: Payer: Self-pay

## 2019-03-22 VITALS — Ht 64.0 in | Wt 167.0 lb

## 2019-03-22 DIAGNOSIS — Z1211 Encounter for screening for malignant neoplasm of colon: Secondary | ICD-10-CM

## 2019-03-22 MED ORDER — SUPREP BOWEL PREP KIT 17.5-3.13-1.6 GM/177ML PO SOLN: 1.0000 | Freq: Once | ORAL | 0 refills | Status: AC

## 2019-03-22 NOTE — Progress Notes (Signed)
No egg or soy allergy known to patient  No issues with past sedation with any surgeries  or procedures, no intubation problems  No diet pills per patient No home 02 use per patient  No blood thinners per patient  Pt denies issues with constipation  No A fib or A flutter  EMMI video sent to pt's e mail   Pt verified name, DOB, address and insurance during PV today. Pt mailed instruction packet to included paper to complete and mail back to [facility] with addressed and stamped envelope, Emmi video, copy of consent form to read and not return, and instructions. Suprep $15  coupon mailed in packet. PV completed over the phone. Pt encouraged to call with questions or issues   Pt is aware that care partner will wait in the car during procedure; if they feel like they will be too hot to wait in the car; they may wait in the lobby.  We want them to wear a mask (we do not have any that we can provide them), practice social distancing, and we will check their temperatures when they get here.  I did remind patient that their care partner needs to stay in the parking lot the entire time. Pt will wear mask into building.

## 2019-03-24 ENCOUNTER — Ambulatory Visit
Admission: RE | Admit: 2019-03-24 | Discharge: 2019-03-24 | Disposition: A | Discharge status: Home / Self Care | Payer: BC Managed Care – PPO | Source: Ambulatory Visit | Attending: Family Medicine | Admitting: Family Medicine

## 2019-03-24 ENCOUNTER — Other Ambulatory Visit: Payer: Self-pay

## 2019-03-24 MED ORDER — METHYLPREDNISOLONE ACETATE 40 MG/ML INJ SUSP (RADIOLOG
120.0000 mg | Freq: Once | INTRAMUSCULAR | Status: AC
  Administered 2019-03-24: 120 mg via EPIDURAL

## 2019-03-24 MED ORDER — IOPAMIDOL (ISOVUE-M 200) INJECTION 41%
1.0000 mL | Freq: Once | INTRAMUSCULAR | Status: AC
  Administered 2019-03-24: 1 mL via EPIDURAL

## 2019-03-24 NOTE — Discharge Instructions (Signed)

## 2019-04-04 ENCOUNTER — Encounter: Payer: Self-pay | Admitting: Gastroenterology

## 2019-04-06 ENCOUNTER — Telehealth: Payer: Self-pay | Admitting: Gastroenterology

## 2019-04-06 NOTE — Telephone Encounter (Signed)
Left message on vmail to call back regarding Covid-19 screening questions. Covid-19 Screening Questions:  Do you now or have you had a fever in the last 14 days?   Do you have any respiratory symptoms of shortness of breath or cough now or in the last 14 days?   Do you have any family members or close contacts with diagnosed or suspected Covid-19 in the past 14 days?   Have you been tested for Covid-19 and found to be positive?

## 2019-04-06 NOTE — Telephone Encounter (Signed)
Patient returned call and answered NO to all the Covid-19 screening questions °

## 2019-04-07 ENCOUNTER — Other Ambulatory Visit: Payer: Self-pay

## 2019-04-07 ENCOUNTER — Ambulatory Visit (AMBULATORY_SURGERY_CENTER): Payer: BC Managed Care – PPO | Admitting: Gastroenterology

## 2019-04-07 ENCOUNTER — Encounter: Payer: Self-pay | Admitting: Gastroenterology

## 2019-04-07 VITALS — BP 96/69 | HR 63 | Temp 98.7°F | Resp 10 | Ht 64.0 in | Wt 168.0 lb

## 2019-04-07 DIAGNOSIS — D125 Benign neoplasm of sigmoid colon: Secondary | ICD-10-CM

## 2019-04-07 DIAGNOSIS — D123 Benign neoplasm of transverse colon: Secondary | ICD-10-CM | POA: Diagnosis not present

## 2019-04-07 DIAGNOSIS — D122 Benign neoplasm of ascending colon: Secondary | ICD-10-CM

## 2019-04-07 DIAGNOSIS — Z1211 Encounter for screening for malignant neoplasm of colon: Secondary | ICD-10-CM

## 2019-04-07 DIAGNOSIS — D120 Benign neoplasm of cecum: Secondary | ICD-10-CM

## 2019-04-07 DIAGNOSIS — D124 Benign neoplasm of descending colon: Secondary | ICD-10-CM

## 2019-04-07 DIAGNOSIS — D127 Benign neoplasm of rectosigmoid junction: Secondary | ICD-10-CM

## 2019-04-07 MED ORDER — SODIUM CHLORIDE 0.9 % IV SOLN: 500.0000 mL | Freq: Once | INTRAVENOUS | Status: DC

## 2019-04-07 NOTE — Progress Notes (Signed)
Report to PACU, RN, vss, BBS= Clear.  

## 2019-04-07 NOTE — Progress Notes (Signed)
Pt's states no medical or surgical changes since previsit or office visit.  Temp and VS taken by CW

## 2019-04-07 NOTE — Op Note (Signed)
[facility] Patient Name: Lindsay George Procedure Date: 04/07/2019 10:36 AM MRN: [id number] Endoscopist: Jackquline Denmark , MD Age: 48 Referring MD:  Date of Birth: May 10, 1971 Gender: Female Account #: [id number] Procedure:                Colonoscopy Indications:              Screening for colorectal malignant neoplasm. Medicines:                Monitored Anesthesia Care Procedure:                Pre-Anesthesia Assessment:                           - Prior to the procedure, a History and Physical                            was performed, and patient medications and                            allergies were reviewed. The patient's tolerance of                            previous anesthesia was also reviewed. The risks                            and benefits of the procedure and the sedation                            options and risks were discussed with the patient.                            All questions were answered, and informed consent                            was obtained. Prior Anticoagulants: The patient has                            taken no previous anticoagulant or antiplatelet                            agents. ASA Grade Assessment: I - A normal, healthy                            patient. After reviewing the risks and benefits,                            the patient was deemed in satisfactory condition to                            undergo the procedure.                           After obtaining informed consent, the colonoscope  was passed under direct vision. Throughout the                            procedure, the patient's blood pressure, pulse, and                            oxygen saturations were monitored continuously. The                            Colonoscope was introduced through the anus and                            advanced to the the cecum, identified by                            appendiceal orifice and ileocecal  valve. The                            colonoscopy was performed without difficulty. The                            patient tolerated the procedure well. The quality                            of the bowel preparation was good. The ileocecal                            valve, appendiceal orifice, and rectum were                            photographed. Scope In: 10:42:23 AM Scope Out: 11:03:01 AM Scope Withdrawal Time: 0 hours 16 minutes 37 seconds  Total Procedure Duration: 0 hours 20 minutes 38 seconds  Findings:                 Two sessile polyps were found in the cecum. The                            polyps were 4 to 6 mm in size. These polyps were                            removed with a cold snare. Resection and retrieval                            were complete.                           Two sessile polyps were found in the mid ascending                            colon. The polyps were 4 to 6 mm in size. These                            polyps were removed with a  cold snare. Resection                            and retrieval were complete. Estimated blood loss:                            none.                           Two sessile polyps were found in the proximal                            transverse colon. The polyps were 2 to 4 mm in                            size. These polyps were removed with a cold biopsy                            forceps. Resection and retrieval were complete.                           Two sessile polyps were found in the proximal                            sigmoid colon. The polyps were 4 to 6 mm in size.                            These polyps were removed with a cold snare.                            Resection and retrieval were complete. Estimated                            blood loss: none.                           A 10 mm polyp was found in the recto-sigmoid colon.                            The polyp was semi-pedunculated. The polyp was                             removed with a hot snare. Resection and retrieval                            were complete. Estimated blood loss: none.                           Rare diverticula in the sigmoid colon.                           Few hyperplastic appearing polyps were visualized  in the rectum and in the distal sigmoid colon,                            confirmed by by narrowband imaging.                           Non-bleeding internal hemorrhoids were found during                            retroflexion. The hemorrhoids were small. Complications:            No immediate complications. Estimated Blood Loss:     Estimated blood loss: none. Impression:               -Colonic polyps status post polypectomy.                           -Internal hemorrhoids. Recommendation:           - Patient has a contact number available for                            emergencies. The signs and symptoms of potential                            delayed complications were discussed with the                            patient. Return to normal activities tomorrow.                            Written discharge instructions were provided to the                            patient.                           - Continue present medications.                           - Await pathology results.                           - Repeat colonoscopy for surveillance based on                            pathology results.                           - Avoid NSAIds x 5 days.                           - Return to GI clinic PRN. Jackquline Denmark, MD 04/07/2019 11:18:40 AM This report has been signed electronically.

## 2019-04-07 NOTE — Progress Notes (Signed)
Called to room to assist during endoscopic procedure.  Patient ID and intended procedure confirmed with present staff. Received instructions for my participation in the procedure from the performing physician.  

## 2019-04-07 NOTE — Patient Instructions (Addendum)
Thank you for allowing [place] to care for you today!  Resume previous diet and medications today.    Return to your normal activities tomorrow.  AVOID ALL NAIDS FOR 5 DAYS  (IBUPROFEN, ALEVE, ASPIRIN)  May use Tylenol for pain.  Await pathology results by mail, approximately 2 weeks.  Will make recommendation for next colonoscopy at that time.        YOU HAD AN ENDOSCOPIC PROCEDURE TODAY AT [facility]:   Refer to the procedure report that was given to you for any specific questions about what was found during the examination.  If the procedure report does not answer your questions, please call your gastroenterologist to clarify.  If you requested that your care partner not be given the details of your procedure findings, then the procedure report has been included in a sealed envelope for you to review at your convenience later.  YOU SHOULD EXPECT: Some feelings of bloating in the abdomen. Passage of more gas than usual.  Walking can help get rid of the air that was put into your GI tract during the procedure and reduce the bloating. If you had a lower endoscopy (such as a colonoscopy or flexible sigmoidoscopy) you may notice spotting of blood in your stool or on the toilet paper. If you underwent a bowel prep for your procedure, you may not have a normal bowel movement for a few days.  Please Note:  You might notice some irritation and congestion in your nose or some drainage.  This is from the oxygen used during your procedure.  There is no need for concern and it should clear up in a day or so.  SYMPTOMS TO REPORT IMMEDIATELY:   Following lower endoscopy (colonoscopy or flexible sigmoidoscopy):  Excessive amounts of blood in the stool  Significant tenderness or worsening of abdominal pains  Swelling of the abdomen that is new, acute  Fever of 100F or higher     For urgent or emergent issues, a gastroenterologist can be reached at any hour by calling [phone]  [phone].   DIET:  We do recommend a small meal at first, but then you may proceed to your regular diet.  Drink plenty of fluids but you should avoid alcoholic beverages for 24 hours.  ACTIVITY:  You should plan to take it easy for the rest of today and you should NOT DRIVE or use heavy machinery until tomorrow (because of the sedation medicines used during the test).    FOLLOW UP: Our staff will call the number listed on your records 48-72 hours following your procedure to check on you and address any questions or concerns that you may have regarding the information given to you following your procedure. If we do not reach you, we will leave a message.  We will attempt to reach you two times.  During this call, we will ask if you have developed any symptoms of COVID 19. If you develop any symptoms (ie: fever, flu-like symptoms, shortness of breath, cough etc.) before then, please call [phone].  If you test positive for Covid 19 in the 2 weeks post procedure, please call and report this information to [place].    If any biopsies were taken you will be contacted by phone or by letter within the next 1-3 weeks.  Please call us at [phone] if you have not heard about the biopsies in 3 weeks.    SIGNATURES/CONFIDENTIALITY: You and/or your care partner have signed paperwork which will be entered  into your electronic medical record.  These signatures attest to the fact that that the information above on your After Visit Summary has been reviewed and is understood.  Full responsibility of the confidentiality of this discharge information lies with you and/or your care-partner.

## 2019-04-11 ENCOUNTER — Telehealth: Payer: Self-pay | Admitting: *Deleted

## 2019-04-11 NOTE — Telephone Encounter (Signed)
  Follow up Call-  Call back number 04/07/2019  Post procedure Call Back phone  # [phone]  Permission to leave phone message Yes  Some recent data might be hidden     Patient questions:  Mailbox is full. Second call.

## 2019-04-11 NOTE — Telephone Encounter (Signed)
  Follow up Call-  Call back number 04/07/2019  Post procedure Call Back phone  # [phone]  Permission to leave phone message Yes  Some recent data might be hidden     Patient questions:  The mailbox is full.

## 2019-04-19 ENCOUNTER — Encounter: Payer: Self-pay | Admitting: Gastroenterology

## 2019-05-26 ENCOUNTER — Encounter: Payer: Self-pay | Admitting: Family Medicine

## 2019-05-26 ENCOUNTER — Other Ambulatory Visit: Payer: Self-pay

## 2019-05-26 ENCOUNTER — Ambulatory Visit (INDEPENDENT_AMBULATORY_CARE_PROVIDER_SITE_OTHER): Payer: BC Managed Care – PPO | Admitting: Family Medicine

## 2019-05-26 VITALS — BP 135/85 | HR 76 | Temp 97.9°F | Wt 167.0 lb

## 2019-05-26 DIAGNOSIS — I452 Bifascicular block: Secondary | ICD-10-CM | POA: Diagnosis not present

## 2019-05-26 DIAGNOSIS — R079 Chest pain, unspecified: Secondary | ICD-10-CM | POA: Diagnosis not present

## 2019-05-26 DIAGNOSIS — I10 Essential (primary) hypertension: Secondary | ICD-10-CM | POA: Diagnosis not present

## 2019-05-26 MED ORDER — NITROGLYCERIN 0.4 MG SL SUBL: 0.4000 mg | SUBLINGUAL_TABLET | SUBLINGUAL | 0 refills | Status: AC | PRN

## 2019-05-26 MED ORDER — OMEPRAZOLE 40 MG PO CPDR: 40.0000 mg | DELAYED_RELEASE_CAPSULE | Freq: Every day | ORAL | 3 refills | Status: DC

## 2019-05-26 NOTE — Progress Notes (Signed)
Vitals:   05/26/19 1341  BP: 135/85  Pulse: 76  Temp: 97.9 F (36.6 C)

## 2019-05-26 NOTE — Patient Instructions (Signed)
Thank you for coming in today. Start ompreazole daily now for heartburn.  Get labs and xray.  Avoid intense exercise.  Use nitroglycerine for chest pain as needed.  Go to the ER if worse.   Call or go to the emergency room if you get worse, have trouble breathing, have chest pains, or palpitations.    Nonspecific Chest Pain, Adult Chest pain can be caused by many different conditions. It can be caused by a condition that is life-threatening and requires treatment right away. It can also be caused by something that is not life-threatening. If you have chest pain, it can be hard to know the difference, so it is important to get help right away to make sure that you do not have a serious condition. Some life-threatening causes of chest pain include:  Heart attack.  A tear in the body's main blood vessel (aortic dissection).  Inflammation around your heart (pericarditis).  A problem in the lungs, such as a blood clot (pulmonary embolism) or a collapsed lung (pneumothorax). Some non life-threatening causes of chest pain include:  Heartburn.  Anxiety or stress.  Damage to the bones, muscles, and cartilage that make up your chest wall.  Pneumonia or bronchitis.  Shingles infection (varicella-zoster virus). Chest pain can feel like:  Pain or discomfort on the surface of your chest or deep in your chest.  Crushing, pressure, aching, or squeezing pain.  Burning or tingling.  Dull or sharp pain that is worse when you move, cough, or take a deep breath.  Pain or discomfort that is also felt in your back, neck, jaw, shoulder, or arm, or pain that spreads to any of these areas. Your chest pain may come and go. It may also be constant. Your health care provider will do lab tests and other studies to find the cause of your pain. Treatment will depend on the cause of your chest pain. Follow these instructions at home: Medicines  Take over-the-counter and prescription medicines only as  told by your health care provider.  If you were prescribed an antibiotic, take it as told by your health care provider. Do not stop taking the antibiotic even if you start to feel better. Lifestyle   Rest as directed by your health care provider.  Do not use any products that contain nicotine or tobacco, such as cigarettes and e-cigarettes. If you need help quitting, ask your health care provider.  Do not drink alcohol.  Make healthy lifestyle choices as recommended. These may include: ? Getting regular exercise. Ask your health care provider to suggest some activities that are safe for you. ? Eating a heart-healthy diet. This includes plenty of fresh fruits and vegetables, whole grains, low-fat (lean) protein, and low-fat dairy products. A dietitian can help you find healthy eating options. ? Maintaining a healthy weight. ? Managing any other health conditions you have, such as high blood pressure (hypertension) or diabetes. ? Reducing stress, such as with yoga or relaxation techniques. General instructions  Pay attention to any changes in your symptoms. Tell your health care provider about them or any new symptoms.  Avoid any activities that cause chest pain.  Keep all follow-up visits as told by your health care provider. This is important. This includes visits for any further testing if your chest pain does not go away. Contact a health care provider if:  Your chest pain does not go away.  You feel depressed.  You have a fever. Get help right away if:  Your chest  pain gets worse.  You have a cough that gets worse, or you cough up blood.  You have severe pain in your abdomen.  You faint.  You have sudden, unexplained chest discomfort.  You have sudden, unexplained discomfort in your arms, back, neck, or jaw.  You have shortness of breath at any time.  You suddenly start to sweat, or your skin gets clammy.  You feel nausea or you vomit.  You suddenly feel  lightheaded or dizzy.  You have severe weakness, or unexplained weakness or fatigue.  Your heart begins to beat quickly, or it feels like it is skipping beats. These symptoms may represent a serious problem that is an emergency. Do not wait to see if the symptoms will go away. Get medical help right away. Call your local emergency services (911 in the [place] Do not drive yourself to the hospital. Summary  Chest pain can be caused by a condition that is serious and requires urgent treatment. It may also be caused by something that is not life-threatening.  If you have chest pain, it is very important to see your health care provider. Your health care provider may do lab tests and other studies to find the cause of your pain.  Follow your health care provider's instructions on taking medicines, making lifestyle changes, and getting emergency treatment if symptoms become worse.  Keep all follow-up visits as told by your health care provider. This includes visits for any further testing if your chest pain does not go away. This information is not intended to replace advice given to you by your health care provider. Make sure you discuss any questions you have with your health care provider. Document Released: 05/21/2005 Document Revised: 02/11/2018 Document Reviewed: 02/11/2018 Elsevier Patient Education  2020 [employer].

## 2019-05-26 NOTE — Progress Notes (Signed)
Lindsay George is a 48 y.o. female who presents to [place]: [place] today for chest pain. Patient states that 1 week ago, patient began experiencing a a sharp chest pain on the left side of her chest. She cannot recall any inciting event that preceded the pain. She describes the pain as sharp when she takes a deep breath. She states that sometimes when walking she will have a minor sensation that it is difficult to take a deep breath. She took OTC heartburn medication which has alleviated the pain. She states the pain will go away when she burps or has gas. She describes recent strenuous exercise so is unable to clarify if the pain worsens with exertion. Two days ago, the pain radiated to her left axilla and arm. She is concerned about the pain which prompted her to come to the office.  She denies burning sensations or pressure. Denies palpitations, n/v, diaphoresis, headache. She hasn't had any recent changes in diet. Her only new medications are carvedilol and atorvastatin which were started 2 months ago.    ROS as above  Exam:  BP 135/85   Pulse 76   Temp 97.9 F (36.6 C) (Oral)   Wt 167 lb (75.8 kg)   BMI 28.67 kg/m  Wt Readings from Last 5 Encounters:  05/26/19 167 lb (75.8 kg)  04/07/19 168 lb (76.2 kg)  03/22/19 167 lb (75.8 kg)  03/10/19 168 lb (76.2 kg)  07/05/18 160 lb (72.6 kg)    Gen: Well NAD HEENT: EOMI,  MMM Lungs: Normal work of breathing. CTABL Heart: RRR no MRG, no palpable thrills. 2+ carotid pulses bilaterally.  Abd: NABS, Soft. Nondistended, Nontender Exts: Brisk capillary refill, warm and well perfused.   Lab and Radiology Results Twelve-lead EKG shows normal sinus rhythm at 72 bpm.  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 No prior EKG to compare to   Assessment and Plan: 48 y.o. female with chest  pain.  Pain is likely GI related given improvement with OTC antacids.  However she does have an abnormal EKG showing bifascicular block pattern and does have risk factors.  Given symptoms are atypical and also have been ongoing now for a week without significant exacerbation likely safe for outpatient work-up.  Will workup cardiac etiology with urgent referral to Cardiology. Will order CXR, CBC, CMP, troponin in the interval to evaluate chest pain. Will start Omeprazole 40 mg daily for acid reflux. Prescribed nitroglycerin as needed for severe chest pain and tightness and advised patient to go to ER with worsening pain.   PDMP not reviewed this encounter. Orders Placed This Encounter  Procedures  . DG Chest 2 View    Order Specific Question:   Reason for exam:    Answer:   chest pain assess intra-thoracic pathology    Order Specific Question:   Is the patient pregnant?    Answer:   No    Order Specific Question:   Preferred imaging location?    Answer:   [facility]  . CBC  . COMPLETE METABOLIC PANEL WITH GFR  . Troponin I  . Ambulatory referral to Cardiology    Referral Priority:   Urgent    Referral Type:   Consultation    Referral Reason:   Specialty Services Required    Requested Specialty:   Cardiology    Number of Visits Requested:   1   Meds ordered this encounter  Medications  . omeprazole (PRILOSEC) 40 MG capsule    Sig: Take 1 capsule (40 mg total) by mouth daily.    Dispense:  30 capsule    Refill:  3  . nitroGLYCERIN (NITROSTAT) 0.4 MG SL tablet    Sig: Place 1 tablet (0.4 mg total) under the tongue every 5 (five) minutes as needed for chest pain (or tightness).    Dispense:  30 tablet    Refill:  0     Historical information moved to improve visibility of documentation.  Past Medical History:  Diagnosis Date  . Allergy   . Anemia    in the past years ago   . Anxiety    at times  . Arthritis    in back, hip, hands - not diagnosed per pt,   .  Asthma    acute asthma per pt.   . Hyperlipidemia    on medications  . Hypertension   . Spondylolisthesis of lumbar region 03/05/2018   Past Surgical History:  Procedure Laterality Date  . CESAREAN SECTION     x1  . PARTIAL HYSTERECTOMY    . TAH RSO 2012 for uterine fibroids     Social History   Tobacco Use  . Smoking status: Never Smoker  . Smokeless tobacco: Never Used  Substance Use Topics  . Alcohol use: Yes    Alcohol/week: 0.0 standard drinks    Comment: social    family history includes Breast cancer (age of onset: 71) in her mother; Hypertension in her brother, daughter, father, maternal aunt, maternal grandfather, maternal grandmother, maternal uncle, mother, paternal aunt, paternal grandfather, paternal grandmother, paternal uncle, sister, and son.  Medications: Current Outpatient Medications  Medication Sig Dispense Refill  . amLODipine (NORVASC) 10 MG tablet Take 1 tablet by mouth once daily 90 tablet 0  . atorvastatin (LIPITOR) 20 MG tablet Take 1 tablet (20 mg total) by mouth daily. 90 tablet 1  . carvedilol (COREG CR) 20 MG 24 hr capsule Take 1 capsule (20 mg total) by mouth daily. 90 capsule 1  . irbesartan-hydrochlorothiazide (AVALIDE) 150-12.5 MG tablet Take 2 tablets by mouth daily. 180 tablet 1   Current Facility-Administered Medications  Medication Dose Route Frequency Provider Last Rate Last Dose  . 0.9 %  sodium chloride infusion  500 mL Intravenous Once Jackquline Denmark, MD       Allergies  Allergen Reactions  . Ace Inhibitors Cough    Cough     Discussed warning signs or symptoms. Please see discharge instructions. Patient expresses understanding.         I personally was present and performed or re-performed the history, physical exam and medical decision-making activities of this service and have verified that the service and findings are accurately documented in the student's note. ___________________________________________ Lynne Leader M.D.,  ABFM., CAQSM. Primary Care and Sports Medicine Adjunct Instructor of [place] of [facility] of Medicine

## 2019-05-27 LAB — COMPLETE METABOLIC PANEL WITH GFR
AG Ratio: 1.6 (calc) (ref 1.0–2.5)
ALT: 28 U/L (ref 6–29)
AST: 17 U/L (ref 10–35)
Albumin: 4.4 g/dL (ref 3.6–5.1)
Alkaline phosphatase (APISO): 104 U/L (ref 37–153)
BUN: 12 mg/dL (ref 7–25)
CO2: 32 mmol/L (ref 20–32)
Calcium: 9.8 mg/dL (ref 8.6–10.4)
Chloride: 104 mmol/L (ref 98–110)
Creat: 0.88 mg/dL (ref 0.50–1.05)
GFR, Est African American: 89 mL/min/{1.73_m2} (ref >=60)
GFR, Est Non African American: 77 mL/min/{1.73_m2} (ref >=60)
Globulin: 2.7 g/dL (calc) (ref 1.9–3.7)
Glucose, Bld: 73 mg/dL (ref 65–99)
Potassium: 3.3 mmol/L — ABNORMAL LOW (ref 3.5–5.3)
Sodium: 144 mmol/L (ref 135–146)
Total Bilirubin: 0.4 mg/dL (ref 0.2–1.2)
Total Protein: 7.1 g/dL (ref 6.1–8.1)

## 2019-05-27 LAB — CBC
HCT: 39.5 % (ref 35.0–45.0)
Hemoglobin: 13.3 g/dL (ref 11.7–15.5)
MCH: 28.7 pg (ref 27.0–33.0)
MCHC: 33.7 g/dL (ref 32.0–36.0)
MCV: 85.3 fL (ref 80.0–100.0)
MPV: 9.3 fL (ref 7.5–12.5)
Platelets: 423 10*3/uL — ABNORMAL HIGH (ref 140–400)
RBC: 4.63 10*6/uL (ref 3.80–5.10)
RDW: 17 % — ABNORMAL HIGH (ref 11.0–15.0)
WBC: 7.6 10*3/uL (ref 3.8–10.8)

## 2019-05-27 LAB — TROPONIN I: Troponin I: <0.01 ng/mL (ref <=0.0)

## 2019-05-27 MED ORDER — POTASSIUM CHLORIDE CRYS ER 20 MEQ PO TBCR: 20.0000 meq | EXTENDED_RELEASE_TABLET | Freq: Every day | ORAL | 3 refills | Status: DC

## 2019-05-27 NOTE — Addendum Note (Signed)
Addended by: Gregor Hams on: 05/27/2019 02:06 PM   Modules accepted: Orders

## 2019-05-30 NOTE — Addendum Note (Signed)
Addended by: Alena Bills R on: 05/30/2019 01:21 PM   Modules accepted: Orders

## 2019-06-27 ENCOUNTER — Other Ambulatory Visit: Payer: Self-pay | Admitting: Family Medicine

## 2019-06-27 DIAGNOSIS — I10 Essential (primary) hypertension: Secondary | ICD-10-CM

## 2019-07-13 ENCOUNTER — Encounter: Payer: Self-pay | Admitting: General Practice

## 2019-10-20 ENCOUNTER — Telehealth (INDEPENDENT_AMBULATORY_CARE_PROVIDER_SITE_OTHER): Payer: BC Managed Care – PPO | Admitting: Family Medicine

## 2019-10-20 ENCOUNTER — Encounter: Payer: Self-pay | Admitting: Family Medicine

## 2019-10-20 VITALS — BP 143/91 | Temp 98.0°F | Ht 64.0 in | Wt 167.0 lb

## 2019-10-20 DIAGNOSIS — I10 Essential (primary) hypertension: Secondary | ICD-10-CM | POA: Diagnosis not present

## 2019-10-20 DIAGNOSIS — J45991 Cough variant asthma: Secondary | ICD-10-CM | POA: Diagnosis not present

## 2019-10-20 DIAGNOSIS — M48061 Spinal stenosis, lumbar region without neurogenic claudication: Secondary | ICD-10-CM | POA: Diagnosis not present

## 2019-10-20 DIAGNOSIS — M4316 Spondylolisthesis, lumbar region: Secondary | ICD-10-CM | POA: Diagnosis not present

## 2019-10-20 MED ORDER — BENZONATATE 100 MG PO CAPS: 100.0000 mg | ORAL_CAPSULE | Freq: Three times a day (TID) | ORAL | 0 refills | Status: DC | PRN

## 2019-10-20 MED ORDER — PREDNISONE 20 MG PO TABS: 20.0000 mg | ORAL_TABLET | Freq: Two times a day (BID) | ORAL | 0 refills | Status: AC

## 2019-10-20 MED ORDER — CARVEDILOL 6.25 MG PO TABS: 6.2500 mg | ORAL_TABLET | Freq: Two times a day (BID) | ORAL | 3 refills | Status: DC

## 2019-10-20 NOTE — Progress Notes (Signed)
Lindsay George - 49 y.o. female MRN [id number]  Date of birth: [birth year]   This visit type was conducted due to national recommendations for restrictions regarding the COVID-19 Pandemic (e.g. social distancing).  This format is felt to be most appropriate for this patient at this time.  All issues noted in this document were discussed and addressed.  No physical exam was performed (except for noted visual exam findings with Video Visits).  I discussed the limitations of evaluation and management by telemedicine and the availability of in person appointments. The patient expressed understanding and agreed to proceed.  I connected with@ on 10/20/19 at 11:10 AM EST by a video enabled telemedicine application and verified that I am speaking with the correct person using two identifiers.  Present at visit: Luetta Nutting, DO Dorthula Nettles   Patient Location: Home [address] [place] [address]   Provider location:   [facility]  Chief Complaint  Patient presents with  . Cough    HPI  Lindsay George is a 49 y.o. female who presents via audio/video conferencing for a telehealth visit today.  She has complaint of cough and congestion, low back pain and medication concern.    -Congestion:   Reports some mild chest congestion with post nasal drainage and cough with clear phlegm.  She has had similar episodes in the past that responded well to cough medication and steroid. She denies fever, chills, chest tightness, nausea, vomiting, diarrhea, headache or body aches.     -Med concern: Reports that coreg is too expensive.  It appear she is on Coreg CR 20mg , started on 02/2019 due to compliance issues with evening dose.    -Low back pain:  History of spinal stenosis at L4-L5.  Has been dealing with this for about 1.5 years.  Has had ESI x3, fairly good relief from last injection in 02/2019.  She was referred to surgeon however has been hesitant to have this completed due  to [place].  She would like to try an additional injection and consider surgery if not effective.     ROS:  A comprehensive ROS was completed and negative except as noted per HPI  Past Medical History:  Diagnosis Date  . Allergy   . Anemia    in the past years ago   . Anxiety    at times  . Arthritis    in back, hip, hands - not diagnosed per pt,   . Asthma    acute asthma per pt.   . Hyperlipidemia    on medications  . Hypertension   . Spondylolisthesis of lumbar region 03/05/2018    Past Surgical History:  Procedure Laterality Date  . CESAREAN SECTION     x1  . PARTIAL HYSTERECTOMY    . TAH RSO 2012 for uterine fibroids      Family History  Problem Relation Age of Onset  . Hypertension Mother   . Breast cancer Mother 51  . Hypertension Father   . Hypertension Sister   . Hypertension Brother   . Hypertension Daughter   . Hypertension Son   . Hypertension Maternal Aunt   . Hypertension Maternal Uncle   . Hypertension Paternal Aunt   . Hypertension Paternal Uncle   . Hypertension Maternal Grandmother   . Hypertension Maternal Grandfather   . Hypertension Paternal Grandmother   . Hypertension Paternal Grandfather   . Colon cancer Neg Hx   . Colon polyps Neg Hx   . Esophageal cancer  Neg Hx   . Rectal cancer Neg Hx   . Stomach cancer Neg Hx     Social History   Socioeconomic History  . Marital status: Divorced    Spouse name: Not on file  . Number of children: Not on file  . Years of education: Not on file  . Highest education level: Not on file  Occupational History  . Not on file  Tobacco Use  . Smoking status: Never Smoker  . Smokeless tobacco: Never Used  Substance and Sexual Activity  . Alcohol use: Yes    Alcohol/week: 0.0 standard drinks    Comment: social   . Drug use: No  . Sexual activity: Yes    Partners: Male  Other Topics Concern  . Not on file  Social History Narrative  . Not on file   Social Determinants of Health   Financial  Resource Strain:   . Difficulty of Paying Living Expenses: Not on file  Food Insecurity:   . Worried About Charity fundraiser in the Last Year: Not on file  . Ran Out of Food in the Last Year: Not on file  Transportation Needs:   . Lack of Transportation (Medical): Not on file  . Lack of Transportation (Non-Medical): Not on file  Physical Activity:   . Days of Exercise per Week: Not on file  . Minutes of Exercise per Session: Not on file  Stress:   . Feeling of Stress : Not on file  Social Connections:   . Frequency of Communication with Friends and Family: Not on file  . Frequency of Social Gatherings with Friends and Family: Not on file  . Attends Religious Services: Not on file  . Active Member of Clubs or Organizations: Not on file  . Attends Archivist Meetings: Not on file  . Marital Status: Not on file  Intimate Partner Violence:   . Fear of Current or Ex-Partner: Not on file  . Emotionally Abused: Not on file  . Physically Abused: Not on file  . Sexually Abused: Not on file     Current Outpatient Medications:  .  amLODipine (NORVASC) 10 MG tablet, Take 1 tablet by mouth once daily, Disp: 90 tablet, Rfl: 0 .  atorvastatin (LIPITOR) 20 MG tablet, Take 1 tablet (20 mg total) by mouth daily., Disp: 90 tablet, Rfl: 1 .  nitroGLYCERIN (NITROSTAT) 0.4 MG SL tablet, Place 1 tablet (0.4 mg total) under the tongue every 5 (five) minutes as needed for chest pain (or tightness)., Disp: 30 tablet, Rfl: 0 .  omeprazole (PRILOSEC) 40 MG capsule, Take 1 capsule (40 mg total) by mouth daily., Disp: 30 capsule, Rfl: 3 .  potassium chloride SA (KLOR-CON) 20 MEQ tablet, Take 1 tablet (20 mEq total) by mouth daily., Disp: 90 tablet, Rfl: 3 .  benzonatate (TESSALON) 100 MG capsule, Take 1-2 capsules (100-200 mg total) by mouth 3 (three) times daily as needed for cough., Disp: 45 capsule, Rfl: 0 .  carvedilol (COREG) 6.25 MG tablet, Take 1 tablet (6.25 mg total) by mouth 2 (two) times  daily with a meal., Disp: 60 tablet, Rfl: 3 .  predniSONE (DELTASONE) 20 MG tablet, Take 1 tablet (20 mg total) by mouth 2 (two) times daily with a meal for 5 days., Disp: 10 tablet, Rfl: 0  Current Facility-Administered Medications:  .  0.9 %  sodium chloride infusion, 500 mL, Intravenous, Once, Jackquline Denmark, MD  EXAM:  VITALS per patient if applicable: BP (!) XX123456  Temp 98 F (36.7 C) (Oral)   Ht 5\' 4"  (1.626 m)   Wt 167 lb (75.8 kg)   BMI 28.67 kg/m   GENERAL: alert, oriented, appears well and in no acute distress  HEENT: atraumatic, conjunttiva clear, no obvious abnormalities on inspection of external nose and ears  NECK: normal movements of the head and neck  LUNGS: on inspection no signs of respiratory distress, breathing rate appears normal, no obvious gross SOB, gasping or wheezing  CV: no obvious cyanosis  MS: moves all visible extremities without noticeable abnormality  PSYCH/NEURO: pleasant and cooperative, no obvious depression or anxiety, speech and thought processing grossly intact  ASSESSMENT AND PLAN:  Discussed the following assessment and plan:  Cough variant asthma Increased cough and congestion, typically has this anually. Symptoms do not seem consistent with COVID. Add steroid burst and tessalon perles. Call for new or worsening symptoms.   Spinal stenosis at L4-L5 level Repeat ESI injection ordered.  Discussed that if this doesn't provide significant, lasting relief for her she should strongly consider surgical options.   HTN (hypertension) Coreg CR is not affordable at this time.  Will change back to IR carvedilol.  Reminded that she must take this twice per day.   Meds ordered this encounter  Medications  . carvedilol (COREG) 6.25 MG tablet    Sig: Take 1 tablet (6.25 mg total) by mouth 2 (two) times daily with a meal.    Dispense:  60 tablet    Refill:  3  . benzonatate (TESSALON) 100 MG capsule    Sig: Take 1-2 capsules (100-200 mg  total) by mouth 3 (three) times daily as needed for cough.    Dispense:  45 capsule    Refill:  0  . predniSONE (DELTASONE) 20 MG tablet    Sig: Take 1 tablet (20 mg total) by mouth 2 (two) times daily with a meal for 5 days.    Dispense:  10 tablet    Refill:  0     30 minutes spent including pre visit preparation, review of prior notes and labs, encounter with patient via video visit and same day documentation.    I discussed the assessment and treatment plan with the patient. The patient was provided an opportunity to ask questions and all were answered. The patient agreed with the plan and demonstrated an understanding of the instructions.   The patient was advised to call back or seek an in-person evaluation if the symptoms worsen or if the condition fails to improve as anticipated.    Luetta Nutting, DO

## 2019-10-20 NOTE — Progress Notes (Signed)
Patient reports congestion and she is having some clear to light green in color.  This started about 2 weeks.  She reports she had prednisone and cough syrup in the past that have worked.  She has taken some cough tablets in the past that helped some.  She reports this happens every winter with all the change of weather  Carvedilol is too expensive and she is not taking that one.   Lindsay George imaging for back injections.

## 2019-10-20 NOTE — Assessment & Plan Note (Signed)
Increased cough and congestion, typically has this anually. Symptoms do not seem consistent with COVID. Add steroid burst and tessalon perles. Call for new or worsening symptoms.

## 2019-10-20 NOTE — Assessment & Plan Note (Signed)
Repeat ESI injection ordered.  Discussed that if this doesn't provide significant, lasting relief for her she should strongly consider surgical options.

## 2019-10-20 NOTE — Assessment & Plan Note (Signed)
Coreg CR is not affordable at this time.  Will change back to IR carvedilol.  Reminded that she must take this twice per day.

## 2019-11-04 ENCOUNTER — Ambulatory Visit
Admission: RE | Admit: 2019-11-04 | Discharge: 2019-11-04 | Disposition: A | Discharge status: Home / Self Care | Payer: BC Managed Care – PPO | Source: Ambulatory Visit | Attending: Family Medicine | Admitting: Family Medicine

## 2019-11-04 ENCOUNTER — Other Ambulatory Visit: Payer: Self-pay

## 2019-11-04 DIAGNOSIS — M48061 Spinal stenosis, lumbar region without neurogenic claudication: Secondary | ICD-10-CM

## 2019-11-04 DIAGNOSIS — M4316 Spondylolisthesis, lumbar region: Secondary | ICD-10-CM

## 2019-11-04 MED ORDER — METHYLPREDNISOLONE ACETATE 40 MG/ML INJ SUSP (RADIOLOG
120.0000 mg | Freq: Once | INTRAMUSCULAR | Status: AC
  Administered 2019-11-04: 120 mg via EPIDURAL

## 2019-11-04 MED ORDER — IOPAMIDOL (ISOVUE-M 200) INJECTION 41%
1.0000 mL | Freq: Once | INTRAMUSCULAR | Status: AC
  Administered 2019-11-04: 1 mL via EPIDURAL

## 2019-11-04 NOTE — Discharge Instructions (Signed)

## 2019-11-26 ENCOUNTER — Other Ambulatory Visit: Payer: Self-pay | Admitting: Osteopathic Medicine

## 2019-11-26 ENCOUNTER — Other Ambulatory Visit: Payer: Self-pay | Admitting: Family Medicine

## 2019-11-26 DIAGNOSIS — I10 Essential (primary) hypertension: Secondary | ICD-10-CM

## 2019-12-08 ENCOUNTER — Ambulatory Visit (INDEPENDENT_AMBULATORY_CARE_PROVIDER_SITE_OTHER): Payer: BC Managed Care – PPO | Admitting: Family Medicine

## 2019-12-08 ENCOUNTER — Encounter: Payer: Self-pay | Admitting: Family Medicine

## 2019-12-08 ENCOUNTER — Other Ambulatory Visit: Payer: Self-pay

## 2019-12-08 VITALS — BP 140/82 | HR 83 | Temp 98.2°F | Ht 64.0 in | Wt 170.0 lb

## 2019-12-08 DIAGNOSIS — R0789 Other chest pain: Secondary | ICD-10-CM | POA: Diagnosis not present

## 2019-12-08 DIAGNOSIS — M4316 Spondylolisthesis, lumbar region: Secondary | ICD-10-CM

## 2019-12-08 MED ORDER — PANTOPRAZOLE SODIUM 40 MG PO TBEC: 40.0000 mg | DELAYED_RELEASE_TABLET | Freq: Two times a day (BID) | ORAL | 0 refills | Status: DC

## 2019-12-08 NOTE — Patient Instructions (Addendum)
Start protonix and take twice per day.  See me again in about 2 weeks    Gastroesophageal Reflux Disease, Adult Gastroesophageal reflux (GER) happens when acid from the stomach flows up into the tube that connects the mouth and the stomach (esophagus). Normally, food travels down the esophagus and stays in the stomach to be digested. With GER, food and stomach acid sometimes move back up into the esophagus. You may have a disease called gastroesophageal reflux disease (GERD) if the reflux:  Happens often.  Causes frequent or very bad symptoms.  Causes problems such as damage to the esophagus. When this happens, the esophagus becomes sore and swollen (inflamed). Over time, GERD can make small holes (ulcers) in the lining of the esophagus. What are the causes? This condition is caused by a problem with the muscle between the esophagus and the stomach. When this muscle is weak or not normal, it does not close properly to keep food and acid from coming back up from the stomach. The muscle can be weak because of:  Tobacco use.  Pregnancy.  Having a certain type of hernia (hiatal hernia).  Alcohol use.  Certain foods and drinks, such as coffee, chocolate, onions, and peppermint. What increases the risk? You are more likely to develop this condition if you:  Are overweight.  Have a disease that affects your connective tissue.  Use NSAID medicines. What are the signs or symptoms? Symptoms of this condition include:  Heartburn.  Difficult or painful swallowing.  The feeling of having a lump in the throat.  A bitter taste in the mouth.  Bad breath.  Having a lot of saliva.  Having an upset or bloated stomach.  Belching.  Chest pain. Different conditions can cause chest pain. Make sure you see your doctor if you have chest pain.  Shortness of breath or noisy breathing (wheezing).  Ongoing (chronic) cough or a cough at night.  Wearing away of the surface of teeth (tooth  enamel).  Weight loss. How is this treated? Treatment will depend on how bad your symptoms are. Your doctor may suggest:  Changes to your diet.  Medicine.  Surgery. Follow these instructions at home: Eating and drinking   Follow a diet as told by your doctor. You may need to avoid foods and drinks such as: ? Coffee and tea (with or without caffeine). ? Drinks that contain alcohol. ? Energy drinks and sports drinks. ? Bubbly (carbonated) drinks or sodas. ? Chocolate and cocoa. ? Peppermint and mint flavorings. ? Garlic and onions. ? Horseradish. ? Spicy and acidic foods. These include peppers, chili powder, curry powder, vinegar, hot sauces, and BBQ sauce. ? Citrus fruit juices and citrus fruits, such as oranges, lemons, and limes. ? Tomato-based foods. These include red sauce, chili, salsa, and pizza with red sauce. ? Fried and fatty foods. These include donuts, french fries, potato chips, and high-fat dressings. ? High-fat meats. These include hot dogs, rib eye steak, sausage, ham, and bacon. ? High-fat dairy items, such as whole milk, butter, and cream cheese.  Eat small meals often. Avoid eating large meals.  Avoid drinking large amounts of liquid with your meals.  Avoid eating meals during the 2-3 hours before bedtime.  Avoid lying down right after you eat.  Do not exercise right after you eat. Lifestyle   Do not use any products that contain nicotine or tobacco. These include cigarettes, e-cigarettes, and chewing tobacco. If you need help quitting, ask your doctor.  Try to lower your  stress. If you need help doing this, ask your doctor.  If you are overweight, lose an amount of weight that is healthy for you. Ask your doctor about a safe weight loss goal. General instructions  Pay attention to any changes in your symptoms.  Take over-the-counter and prescription medicines only as told by your doctor. Do not take aspirin, ibuprofen, or other NSAIDs unless your  doctor says it is okay.  Wear loose clothes. Do not wear anything tight around your waist.  Raise (elevate) the head of your bed about 6 inches (15 cm).  Avoid bending over if this makes your symptoms worse.  Keep all follow-up visits as told by your doctor. This is important. Contact a doctor if:  You have new symptoms.  You lose weight and you do not know why.  You have trouble swallowing or it hurts to swallow.  You have wheezing or a cough that keeps happening.  Your symptoms do not get better with treatment.  You have a hoarse voice. Get help right away if:  You have pain in your arms, neck, jaw, teeth, or back.  You feel sweaty, dizzy, or light-headed.  You have chest pain or shortness of breath.  You throw up (vomit) and your throw-up looks like blood or coffee grounds.  You pass out (faint).  Your poop (stool) is bloody or black.  You cannot swallow, drink, or eat. Summary  If a person has gastroesophageal reflux disease (GERD), food and stomach acid move back up into the esophagus and cause symptoms or problems such as damage to the esophagus.  Treatment will depend on how bad your symptoms are.  Follow a diet as told by your doctor.  Take all medicines only as told by your doctor. This information is not intended to replace advice given to you by your health care provider. Make sure you discuss any questions you have with your health care provider. Document Revised: 02/17/2018 Document Reviewed: 02/17/2018 Elsevier Patient Education  [place].

## 2019-12-11 ENCOUNTER — Encounter: Payer: Self-pay | Admitting: Family Medicine

## 2019-12-11 DIAGNOSIS — R0789 Other chest pain: Secondary | ICD-10-CM | POA: Insufficient documentation

## 2019-12-11 NOTE — Assessment & Plan Note (Signed)
Her chest pain seems to likely related to GERD.  This is likely worsened by frequent NSAID use.  Educated on NSAIDS an how they can worsen reflux symptoms and lead to PUD/Gastritis. Discussed trying tylenol instead for pain control.  Will change PPI to protonix at 40mg  BID for the next 2 weeks. If symptoms are not improving will refer to GI for consideration of endoscopy.

## 2019-12-11 NOTE — Assessment & Plan Note (Signed)
ESI helpful but only for short period of time.  Referral to sports medicine.

## 2019-12-11 NOTE — Progress Notes (Signed)
Lindsay George - 49 y.o. female MRN [id number]  Date of birth: [birth year]  Subjective Chief Complaint  Patient presents with  . Chest Pain    HPI Lindsay George is a 49 y.o. female here today for follow up of chest pain.  She was admitted in 05/2019 for chest pain and had negative work up including negative stress test.  She has continued to have chest pain and epigastric pain.  This is often associated with eating.  She does have heartburn symptoms including burping acid.  She denies changes in her chest pain since October.  Pain is not worsened with exertion and there is no associated shortness of breath.  She does admit to taking ibuprofen nearly every day for back pain.  She does have omeprazole but is not taking very often.   In regards to her back pain she reports that this persists.  She had ESI on 11/04/19 which provided some relief for a couple of weeks.  She is taking ibuprofen and occasionally goody/bc powders for pain control.  She does have some radicular symptoms into the R leg.   ROS:  A comprehensive ROS was completed and negative except as noted per HPI  Allergies  Allergen Reactions  . Ace Inhibitors Cough    Cough    Past Medical History:  Diagnosis Date  . Allergy   . Anemia    in the past years ago   . Anxiety    at times  . Arthritis    in back, hip, hands - not diagnosed per pt,   . Asthma    acute asthma per pt.   . Hyperlipidemia    on medications  . Hypertension   . Spondylolisthesis of lumbar region 03/05/2018    Past Surgical History:  Procedure Laterality Date  . CESAREAN SECTION     x1  . PARTIAL HYSTERECTOMY    . TAH RSO 2012 for uterine fibroids      Social History   Socioeconomic History  . Marital status: Divorced    Spouse name: Not on file  . Number of children: Not on file  . Years of education: Not on file  . Highest education level: Not on file  Occupational History  . Not on file  Tobacco Use  . Smoking status:  Never Smoker  . Smokeless tobacco: Never Used  Substance and Sexual Activity  . Alcohol use: Yes    Alcohol/week: 0.0 standard drinks    Comment: social   . Drug use: No  . Sexual activity: Yes    Partners: Male  Other Topics Concern  . Not on file  Social History Narrative  . Not on file   Social Determinants of Health   Financial Resource Strain:   . Difficulty of Paying Living Expenses:   Food Insecurity:   . Worried About Charity fundraiser in the Last Year:   . Arboriculturist in the Last Year:   Transportation Needs:   . Film/video editor (Medical):   Marland Kitchen Lack of Transportation (Non-Medical):   Physical Activity:   . Days of Exercise per Week:   . Minutes of Exercise per Session:   Stress:   . Feeling of Stress :   Social Connections:   . Frequency of Communication with Friends and Family:   . Frequency of Social Gatherings with Friends and Family:   . Attends Religious Services:   . Active Member of Clubs or Organizations:   . Attends  Club or Organization Meetings:   Marland Kitchen Marital Status:     Family History  Problem Relation Age of Onset  . Hypertension Mother   . Breast cancer Mother 53  . Hypertension Father   . Hypertension Sister   . Hypertension Brother   . Hypertension Daughter   . Hypertension Son   . Hypertension Maternal Aunt   . Hypertension Maternal Uncle   . Hypertension Paternal Aunt   . Hypertension Paternal Uncle   . Hypertension Maternal Grandmother   . Hypertension Maternal Grandfather   . Hypertension Paternal Grandmother   . Hypertension Paternal Grandfather   . Colon cancer Neg Hx   . Colon polyps Neg Hx   . Esophageal cancer Neg Hx   . Rectal cancer Neg Hx   . Stomach cancer Neg Hx     Health Maintenance  Topic Date Due  . HIV Screening  10/19/2020 (Originally 12/09/1983)  . INFLUENZA VACCINE  03/25/2020  . MAMMOGRAM  03/02/2021  . COLONOSCOPY  04/06/2022  . TETANUS/TDAP  10/23/2027      ----------------------------------------------------------------------------------------------------------------------------------------------------------------------------------------------------------------- Physical Exam BP 140/82   Pulse 83   Temp 98.2 F (36.8 C) (Oral)   Ht 5\' 4"  (1.626 m)   Wt 170 lb (77.1 kg)   BMI 29.18 kg/m   Physical Exam Constitutional:      Appearance: She is well-developed.  HENT:     Head: Normocephalic and atraumatic.  Eyes:     General: No scleral icterus. Cardiovascular:     Rate and Rhythm: Normal rate and regular rhythm.  Pulmonary:     Effort: Pulmonary effort is normal.     Breath sounds: Normal breath sounds.  Abdominal:     General: Abdomen is flat. There is no distension.     Palpations: Abdomen is soft.     Tenderness: There is no abdominal tenderness.  Musculoskeletal:     Cervical back: Neck supple.  Skin:    General: Skin is warm and dry.  Neurological:     General: No focal deficit present.     Mental Status: She is alert.     ------------------------------------------------------------------------------------------------------------------------------------------------------------------------------------------------------------------- Assessment and Plan  Atypical chest pain Her chest pain seems to likely related to GERD.  This is likely worsened by frequent NSAID use.  Educated on NSAIDS an how they can worsen reflux symptoms and lead to PUD/Gastritis. Discussed trying tylenol instead for pain control.  Will change PPI to protonix at 40mg  BID for the next 2 weeks. If symptoms are not improving will refer to GI for consideration of endoscopy.     Spondylolisthesis of lumbar region ESI helpful but only for short period of time.  Referral to sports medicine.    Meds ordered this encounter  Medications  . pantoprazole (PROTONIX) 40 MG tablet    Sig: Take 1 tablet (40 mg total) by mouth 2 (two) times daily.     Dispense:  30 tablet    Refill:  0    Return in about 2 weeks (around 12/22/2019) for GERD.    This visit occurred during the SARS-CoV-2 public health emergency.  Safety protocols were in place, including screening questions prior to the visit, additional usage of staff PPE, and extensive cleaning of exam room while observing appropriate contact time as indicated for disinfecting solutions.

## 2019-12-15 ENCOUNTER — Ambulatory Visit (INDEPENDENT_AMBULATORY_CARE_PROVIDER_SITE_OTHER): Payer: BC Managed Care – PPO | Admitting: Family Medicine

## 2019-12-15 ENCOUNTER — Encounter: Payer: Self-pay | Admitting: Family Medicine

## 2019-12-15 ENCOUNTER — Other Ambulatory Visit: Payer: Self-pay

## 2019-12-15 VITALS — BP 150/97 | HR 77 | Ht 64.0 in | Wt 165.0 lb

## 2019-12-15 DIAGNOSIS — M4316 Spondylolisthesis, lumbar region: Secondary | ICD-10-CM | POA: Diagnosis not present

## 2019-12-15 DIAGNOSIS — M47816 Spondylosis without myelopathy or radiculopathy, lumbar region: Secondary | ICD-10-CM

## 2019-12-15 NOTE — Assessment & Plan Note (Signed)
Discussed facet injections today.  The pain is been ongoing for some time and has had little improvement with treatment thus far. -Referral to surgery.

## 2019-12-15 NOTE — Progress Notes (Signed)
Lindsay George - 49 y.o. female MRN [id number]  Date of birth: 11-Jun-1971  SUBJECTIVE:  Including CC & ROS.  Chief Complaint  Patient presents with  . Back Pain    bilateral low back    Lindsay George is a 49 y.o. female that is presenting with acute on chronic bilateral lower back pain as well as radicular symptoms.  Symptoms been ongoing for some time.  She has tried epidural injection recently that failed to improve her symptoms.  She has tried several medications.  She has tried physical therapy.  Independent review of the MRI lumbar spine from 2019 shows a anterior listhesis at L4-5 with severe facet degeneration.  She has severe spinal stenosis and severe foraminal encroachment.   Review of Systems See HPI   HISTORY: Past Medical, Surgical, Social, and Family History Reviewed & Updated per EMR.   Pertinent Historical Findings include:  Past Medical History:  Diagnosis Date  . Allergy   . Anemia    in the past years ago   . Anxiety    at times  . Arthritis    in back, hip, hands - not diagnosed per pt,   . Asthma    acute asthma per pt.   . Hyperlipidemia    on medications  . Hypertension   . Spondylolisthesis of lumbar region 03/05/2018    Past Surgical History:  Procedure Laterality Date  . CESAREAN SECTION     x1  . PARTIAL HYSTERECTOMY    . TAH RSO 2012 for uterine fibroids      Family History  Problem Relation Age of Onset  . Hypertension Mother   . Breast cancer Mother 39  . Hypertension Father   . Hypertension Sister   . Hypertension Brother   . Hypertension Daughter   . Hypertension Son   . Hypertension Maternal Aunt   . Hypertension Maternal Uncle   . Hypertension Paternal Aunt   . Hypertension Paternal Uncle   . Hypertension Maternal Grandmother   . Hypertension Maternal Grandfather   . Hypertension Paternal Grandmother   . Hypertension Paternal Grandfather   . Colon cancer Neg Hx   . Colon polyps Neg Hx   . Esophageal cancer  Neg Hx   . Rectal cancer Neg Hx   . Stomach cancer Neg Hx     Social History   Socioeconomic History  . Marital status: Divorced    Spouse name: Not on file  . Number of children: Not on file  . Years of education: Not on file  . Highest education level: Not on file  Occupational History  . Not on file  Tobacco Use  . Smoking status: Never Smoker  . Smokeless tobacco: Never Used  Substance and Sexual Activity  . Alcohol use: Yes    Alcohol/week: 0.0 standard drinks    Comment: social   . Drug use: No  . Sexual activity: Yes    Partners: Male  Other Topics Concern  . Not on file  Social History Narrative  . Not on file   Social Determinants of Health   Financial Resource Strain:   . Difficulty of Paying Living Expenses:   Food Insecurity:   . Worried About Charity fundraiser in the Last Year:   . Arboriculturist in the Last Year:   Transportation Needs:   . Film/video editor (Medical):   Marland Kitchen Lack of Transportation (Non-Medical):   Physical Activity:   . Days of Exercise per Week:   .  Minutes of Exercise per Session:   Stress:   . Feeling of Stress :   Social Connections:   . Frequency of Communication with Friends and Family:   . Frequency of Social Gatherings with Friends and Family:   . Attends Religious Services:   . Active Member of Clubs or Organizations:   . Attends Archivist Meetings:   Marland Kitchen Marital Status:   Intimate Partner Violence:   . Fear of Current or Ex-Partner:   . Emotionally Abused:   Marland Kitchen Physically Abused:   . Sexually Abused:      PHYSICAL EXAM:  VS: BP (!) 150/97   Pulse 77   Ht 5\' 4"  (1.626 m)   Wt 165 lb (74.8 kg)   BMI 28.32 kg/m  Physical Exam Gen: NAD, alert, cooperative with exam, well-appearing MSK:  Back: Normal flexion extension. Normal strength resistance with hip flexion. Pain with straight leg raise. Neurovascularly intact     ASSESSMENT & PLAN:   Facet arthritis of lumbar region Discussed  facet injections today.  The pain is been ongoing for some time and has had little improvement with treatment thus far. -Referral to surgery.  Spondylolisthesis of lumbar region She has tried epidurals as well as physical therapy and other medications.  Pain is acute on chronic.  She is ready discussed with surgeon. -Counseled supportive care. -Referral to surgery.

## 2019-12-15 NOTE — Patient Instructions (Signed)
Nice to meet you Dr. Albesa Seen office should contact you about an appointment   Please send me a message in [place] with any questions or updates.  Please see [place] as needed.   --Dr. Raeford Razor

## 2019-12-15 NOTE — Assessment & Plan Note (Signed)
She has tried epidurals as well as physical therapy and other medications.  Pain is acute on chronic.  She is ready discussed with surgeon. -Counseled supportive care. -Referral to surgery.

## 2019-12-22 ENCOUNTER — Other Ambulatory Visit: Payer: Self-pay

## 2019-12-22 ENCOUNTER — Ambulatory Visit (INDEPENDENT_AMBULATORY_CARE_PROVIDER_SITE_OTHER): Payer: BC Managed Care – PPO | Admitting: Family Medicine

## 2019-12-22 ENCOUNTER — Encounter: Payer: Self-pay | Admitting: Family Medicine

## 2019-12-22 DIAGNOSIS — K219 Gastro-esophageal reflux disease without esophagitis: Secondary | ICD-10-CM

## 2019-12-22 MED ORDER — PANTOPRAZOLE SODIUM 40 MG PO TBEC: DELAYED_RELEASE_TABLET | ORAL | 3 refills | Status: DC

## 2019-12-22 NOTE — Patient Instructions (Signed)
Continue the pantoprazole twice daily for two more weeks then back to once per day.  If you experience increased symptoms after going back to once daily let me know.

## 2019-12-22 NOTE — Progress Notes (Signed)
Lindsay George - 49 y.o. female MRN [id number]  Date of birth: March 01, 1971  Subjective Chief Complaint  Patient presents with  . Gastroesophageal Reflux    HPI Lindsay George is a 49 y.o. female here today for follow up of GERD.  She was having continual breakthrough GERD symptoms.  She reports that this has pretty much resolved today.   I had her increase her protonix to 40mg  BID at last visit.  She has also discontinued ibuprofen and goody powders.  She is now taking tylenol arthritis for her back pain, this is working ok for her.  She has been referred to a surgeon to discuss next steps in regards to her back pain.   ROS:  A comprehensive ROS was completed and negative except as noted per HPI  Allergies  Allergen Reactions  . Ace Inhibitors Cough    Cough    Past Medical History:  Diagnosis Date  . Allergy   . Anemia    in the past years ago   . Anxiety    at times  . Arthritis    in back, hip, hands - not diagnosed per pt,   . Asthma    acute asthma per pt.   . Hyperlipidemia    on medications  . Hypertension   . Spondylolisthesis of lumbar region 03/05/2018    Past Surgical History:  Procedure Laterality Date  . CESAREAN SECTION     x1  . PARTIAL HYSTERECTOMY    . TAH RSO 2012 for uterine fibroids      Social History   Socioeconomic History  . Marital status: Divorced    Spouse name: Not on file  . Number of children: Not on file  . Years of education: Not on file  . Highest education level: Not on file  Occupational History  . Not on file  Tobacco Use  . Smoking status: Never Smoker  . Smokeless tobacco: Never Used  Substance and Sexual Activity  . Alcohol use: Yes    Alcohol/week: 0.0 standard drinks    Comment: social   . Drug use: No  . Sexual activity: Yes    Partners: Male  Other Topics Concern  . Not on file  Social History Narrative  . Not on file   Social Determinants of Health   Financial Resource Strain:   . Difficulty  of Paying Living Expenses:   Food Insecurity:   . Worried About Charity fundraiser in the Last Year:   . Arboriculturist in the Last Year:   Transportation Needs:   . Film/video editor (Medical):   Marland Kitchen Lack of Transportation (Non-Medical):   Physical Activity:   . Days of Exercise per Week:   . Minutes of Exercise per Session:   Stress:   . Feeling of Stress :   Social Connections:   . Frequency of Communication with Friends and Family:   . Frequency of Social Gatherings with Friends and Family:   . Attends Religious Services:   . Active Member of Clubs or Organizations:   . Attends Archivist Meetings:   Marland Kitchen Marital Status:     Family History  Problem Relation Age of Onset  . Hypertension Mother   . Breast cancer Mother 74  . Hypertension Father   . Hypertension Sister   . Hypertension Brother   . Hypertension Daughter   . Hypertension Son   . Hypertension Maternal Aunt   . Hypertension Maternal Uncle   .  Hypertension Paternal Aunt   . Hypertension Paternal Uncle   . Hypertension Maternal Grandmother   . Hypertension Maternal Grandfather   . Hypertension Paternal Grandmother   . Hypertension Paternal Grandfather   . Colon cancer Neg Hx   . Colon polyps Neg Hx   . Esophageal cancer Neg Hx   . Rectal cancer Neg Hx   . Stomach cancer Neg Hx     Health Maintenance  Topic Date Due  . COVID-19 Vaccine (2 - Pfizer 2-dose series) 12/23/2019  . HIV Screening  10/19/2020 (Originally 12/09/1983)  . INFLUENZA VACCINE  03/25/2020  . MAMMOGRAM  03/02/2021  . COLONOSCOPY  04/06/2022  . TETANUS/TDAP  10/23/2027     ----------------------------------------------------------------------------------------------------------------------------------------------------------------------------------------------------------------- Physical Exam BP (!) 147/84 (BP Location: Left Arm, Patient Position: Sitting, Cuff Size: Large)   Pulse 83   Ht 5' 4.17" (1.63 m)   Wt 167  lb 8 oz (76 kg)   SpO2 98%   BMI 28.60 kg/m   Physical Exam HENT:     Ears:     Comments:      Mouth/Throat:     Mouth: Mucous membranes are moist.  Eyes:     General: No scleral icterus. Cardiovascular:     Rate and Rhythm: Normal rate and regular rhythm.  Pulmonary:     Effort: Pulmonary effort is normal.     Breath sounds: Normal breath sounds.  Musculoskeletal:     Cervical back: Neck supple.  Skin:    General: Skin is warm and dry.  Neurological:     Mental Status: She is alert.  Psychiatric:        Mood and Affect: Mood normal.        Behavior: Behavior normal.     ------------------------------------------------------------------------------------------------------------------------------------------------------------------------------------------------------------------- Assessment and Plan  GERD (gastroesophageal reflux disease) Much better controlled.  Continue protonix bid for an additional 2 weeks then back to once daily.  Continue to limit NSAIDs.    Meds ordered this encounter  Medications  . pantoprazole (PROTONIX) 40 MG tablet    Sig: Take PO BID x2 additional weeks then change back to once daily.    Dispense:  45 tablet    Refill:  3    No follow-ups on file.    This visit occurred during the SARS-CoV-2 public health emergency.  Safety protocols were in place, including screening questions prior to the visit, additional usage of staff PPE, and extensive cleaning of exam room while observing appropriate contact time as indicated for disinfecting solutions.

## 2019-12-22 NOTE — Assessment & Plan Note (Signed)
Much better controlled.  Continue protonix bid for an additional 2 weeks then back to once daily.  Continue to limit NSAIDs.

## 2020-01-09 ENCOUNTER — Other Ambulatory Visit: Payer: Self-pay | Admitting: Family Medicine

## 2020-01-09 ENCOUNTER — Other Ambulatory Visit: Payer: Self-pay | Admitting: Sports Medicine

## 2020-01-09 DIAGNOSIS — I10 Essential (primary) hypertension: Secondary | ICD-10-CM

## 2020-01-09 NOTE — Telephone Encounter (Signed)
To PCP

## 2020-01-09 NOTE — Telephone Encounter (Signed)
Please see message and advise.  Thank you. ° °

## 2020-02-02 ENCOUNTER — Ambulatory Visit (INDEPENDENT_AMBULATORY_CARE_PROVIDER_SITE_OTHER): Payer: BC Managed Care – PPO | Admitting: Family Medicine

## 2020-02-02 ENCOUNTER — Other Ambulatory Visit: Payer: Self-pay

## 2020-02-02 ENCOUNTER — Encounter: Payer: Self-pay | Admitting: Family Medicine

## 2020-02-02 DIAGNOSIS — M48061 Spinal stenosis, lumbar region without neurogenic claudication: Secondary | ICD-10-CM

## 2020-02-02 MED ORDER — HYDROCODONE-ACETAMINOPHEN 5-325 MG PO TABS: 0.5000 | ORAL_TABLET | Freq: Four times a day (QID) | ORAL | 0 refills | Status: AC | PRN

## 2020-02-02 NOTE — Progress Notes (Signed)
Lindsay George - 49 y.o. female MRN [id number]  Date of birth: [birth year]  Subjective Chief Complaint  Patient presents with  . Establish Care    HPI Lindsay George is a 49 y.o. female with history of HTN and chronic low back pain with spinal stenosis.  She has seen spine surgeon and is scheduled for surgery in Mid July.  She is requesting to have FMLA paperwork renewed for intermittent leave until her surgery date.  She is also requesting short term medication to help with pain as gabapentin is not helping much.    ROS:  A comprehensive ROS was completed and negative except as noted per HPI  Allergies  Allergen Reactions  . Ace Inhibitors Cough    Cough    Past Medical History:  Diagnosis Date  . Allergy   . Anemia    in the past years ago   . Anxiety    at times  . Arthritis    in back, hip, hands - not diagnosed per pt,   . Asthma    acute asthma per pt.   . Hyperlipidemia    on medications  . Hypertension   . Spondylolisthesis of lumbar region 03/05/2018    Past Surgical History:  Procedure Laterality Date  . CESAREAN SECTION     x1  . PARTIAL HYSTERECTOMY    . TAH RSO 2012 for uterine fibroids      Social History   Socioeconomic History  . Marital status: Divorced    Spouse name: Not on file  . Number of children: Not on file  . Years of education: Not on file  . Highest education level: Not on file  Occupational History  . Not on file  Tobacco Use  . Smoking status: Never Smoker  . Smokeless tobacco: Never Used  Vaping Use  . Vaping Use: Never used  Substance and Sexual Activity  . Alcohol use: Yes    Alcohol/week: 0.0 standard drinks    Comment: social   . Drug use: No  . Sexual activity: Yes    Partners: Male  Other Topics Concern  . Not on file  Social History Narrative  . Not on file   Social Determinants of Health   Financial Resource Strain:   . Difficulty of Paying Living Expenses:   Food Insecurity:   . Worried About  Charity fundraiser in the Last Year:   . Arboriculturist in the Last Year:   Transportation Needs:   . Film/video editor (Medical):   Marland Kitchen Lack of Transportation (Non-Medical):   Physical Activity:   . Days of Exercise per Week:   . Minutes of Exercise per Session:   Stress:   . Feeling of Stress :   Social Connections:   . Frequency of Communication with Friends and Family:   . Frequency of Social Gatherings with Friends and Family:   . Attends Religious Services:   . Active Member of Clubs or Organizations:   . Attends Archivist Meetings:   Marland Kitchen Marital Status:     Family History  Problem Relation Age of Onset  . Hypertension Mother   . Breast cancer Mother 75  . Hypertension Father   . Hypertension Sister   . Hypertension Brother   . Hypertension Daughter   . Hypertension Son   . Hypertension Maternal Aunt   . Hypertension Maternal Uncle   . Hypertension Paternal Aunt   . Hypertension Paternal Uncle   .  Hypertension Maternal Grandmother   . Hypertension Maternal Grandfather   . Hypertension Paternal Grandmother   . Hypertension Paternal Grandfather   . Colon cancer Neg Hx   . Colon polyps Neg Hx   . Esophageal cancer Neg Hx   . Rectal cancer Neg Hx   . Stomach cancer Neg Hx     Health Maintenance  Topic Date Due  . Hepatitis C Screening  Never done  . HIV Screening  10/19/2020 (Originally [birth year]/1983)  . INFLUENZA VACCINE  03/25/2020  . MAMMOGRAM  03/02/2021  . COLONOSCOPY  04/06/2022  . TETANUS/TDAP  10/23/2027  . COVID-19 Vaccine  Completed     ----------------------------------------------------------------------------------------------------------------------------------------------------------------------------------------------------------------- Physical Exam BP (!) 143/91 (BP Location: Left Arm, Patient Position: Sitting, Cuff Size: Normal)   Pulse 76   Temp 99.2 F (37.3 C) (Oral)   Ht 5' 4.17" (1.63 m)   Wt 171 lb (77.6 kg)   SpO2  100%   BMI 29.19 kg/m   Physical Exam Constitutional:      Appearance: Normal appearance.  Eyes:     General: No scleral icterus. Cardiovascular:     Rate and Rhythm: Normal rate and regular rhythm.  Pulmonary:     Effort: Pulmonary effort is normal.     Breath sounds: Normal breath sounds.  Skin:    General: Skin is warm and dry.  Neurological:     General: No focal deficit present.     Mental Status: She is alert.  Psychiatric:        Mood and Affect: Mood normal.        Behavior: Behavior normal.     ------------------------------------------------------------------------------------------------------------------------------------------------------------------------------------------------------------------- Assessment and Plan  Spinal stenosis at L4-L5 level Has upcoming surgery in July.  Will provide intermittent FMLA until then, however surgeon will need to complete paperwork for leave related to he surgery.  Provided with short term supply of hydrocodone until surgery.  No opioid pain meds will be provided after surgery.    Meds ordered this encounter  Medications  . HYDROcodone-acetaminophen (NORCO) 5-325 MG tablet    Sig: Take 0.5-1 tablets by mouth every 6 (six) hours as needed for up to 5 days for severe pain.    Dispense:  15 tablet    Refill:  0    No follow-ups on file.    This visit occurred during the SARS-CoV-2 public health emergency.  Safety protocols were in place, including screening questions prior to the visit, additional usage of staff PPE, and extensive cleaning of exam room while observing appropriate contact time as indicated for disinfecting solutions.

## 2020-02-02 NOTE — Assessment & Plan Note (Signed)
Has upcoming surgery in July.  Will provide intermittent FMLA until then, however surgeon will need to complete paperwork for leave related to he surgery.  Provided with short term supply of hydrocodone until surgery.  No opioid pain meds will be provided after surgery.

## 2020-02-19 ENCOUNTER — Other Ambulatory Visit: Payer: Self-pay | Admitting: Family Medicine

## 2020-03-14 ENCOUNTER — Other Ambulatory Visit: Payer: Self-pay | Admitting: Family Medicine

## 2020-04-21 ENCOUNTER — Other Ambulatory Visit: Payer: Self-pay | Admitting: Family Medicine

## 2020-05-08 ENCOUNTER — Other Ambulatory Visit: Payer: Self-pay | Admitting: Family Medicine

## 2020-05-09 ENCOUNTER — Ambulatory Visit: Payer: BC Managed Care – PPO | Admitting: Family Medicine

## 2020-06-19 ENCOUNTER — Other Ambulatory Visit: Payer: Self-pay | Admitting: Family Medicine

## 2020-06-27 ENCOUNTER — Other Ambulatory Visit: Payer: Self-pay | Admitting: Family Medicine

## 2020-06-27 DIAGNOSIS — Z1231 Encounter for screening mammogram for malignant neoplasm of breast: Secondary | ICD-10-CM

## 2020-06-28 ENCOUNTER — Ambulatory Visit
Admission: RE | Admit: 2020-06-28 | Discharge: 2020-06-28 | Disposition: A | Discharge status: Home / Self Care | Payer: BC Managed Care – PPO | Source: Ambulatory Visit | Attending: Family Medicine | Admitting: Family Medicine

## 2020-06-28 ENCOUNTER — Other Ambulatory Visit: Payer: Self-pay

## 2020-06-28 DIAGNOSIS — Z1231 Encounter for screening mammogram for malignant neoplasm of breast: Secondary | ICD-10-CM

## 2020-07-02 ENCOUNTER — Telehealth (INDEPENDENT_AMBULATORY_CARE_PROVIDER_SITE_OTHER): Payer: BC Managed Care – PPO | Admitting: Family Medicine

## 2020-07-02 ENCOUNTER — Encounter: Payer: Self-pay | Admitting: Family Medicine

## 2020-07-02 VITALS — BP 161/104 | HR 79 | Wt 169.0 lb

## 2020-07-02 DIAGNOSIS — I10 Essential (primary) hypertension: Secondary | ICD-10-CM

## 2020-07-02 DIAGNOSIS — J0100 Acute maxillary sinusitis, unspecified: Secondary | ICD-10-CM | POA: Diagnosis not present

## 2020-07-02 DIAGNOSIS — E7800 Pure hypercholesterolemia, unspecified: Secondary | ICD-10-CM | POA: Diagnosis not present

## 2020-07-02 MED ORDER — ATORVASTATIN CALCIUM 20 MG PO TABS: ORAL_TABLET | ORAL | 0 refills | Status: DC

## 2020-07-02 MED ORDER — AMOXICILLIN-POT CLAVULANATE 875-125 MG PO TABS: 1.0000 | ORAL_TABLET | Freq: Two times a day (BID) | ORAL | 0 refills | Status: DC

## 2020-07-02 MED ORDER — AMLODIPINE BESYLATE 10 MG PO TABS: 10.0000 mg | ORAL_TABLET | Freq: Every day | ORAL | 1 refills | Status: DC

## 2020-07-02 MED ORDER — CARVEDILOL 6.25 MG PO TABS: ORAL_TABLET | ORAL | 0 refills | Status: DC

## 2020-07-02 MED ORDER — IRBESARTAN-HYDROCHLOROTHIAZIDE 150-12.5 MG PO TABS: 2.0000 | ORAL_TABLET | Freq: Every day | ORAL | 2 refills | Status: DC

## 2020-07-02 NOTE — Progress Notes (Signed)
Lindsay George - 49 y.o. female MRN [id number]  Date of birth: [birth year]   This visit type was conducted due to national recommendations for restrictions regarding the COVID-19 Pandemic (e.g. social distancing).  This format is felt to be most appropriate for this patient at this time.  All issues noted in this document were discussed and addressed.  No physical exam was performed (except for noted visual exam findings with Video Visits).  I discussed the limitations of evaluation and management by telemedicine and the availability of in person appointments. The patient expressed understanding and agreed to proceed.  I connected with@ on 07/02/20 at 10:30 AM EST by a video enabled telemedicine application and verified that I am speaking with the correct person using two identifiers.  Present at visit: Luetta Nutting, DO Dorthula Nettles   Patient Location: Home [address] [place] [address]   Provider location:   [facility]  Chief Complaint  Patient presents with  . Sinusitis    HPI  Lindsay George is a 49 y.o. female who presents via audio/video conferencing for a telehealth visit today.  She has complaint of focal sinus pain over the L maxillary sinus as well as headache.  Symptoms started about 1 week ago. She denies fever, cough, sore throat, chills, nausea or vomiting, body aches.  She has been using OTC decongestant and some left over amoxicillin.   BP is elevated today.  She has been out of a couple of medications and taking decongestant.  She denies symptoms related to HTN.    ROS:  A comprehensive ROS was completed and negative except as noted per HPI  Past Medical History:  Diagnosis Date  . Allergy   . Anemia    in the past years ago   . Anxiety    at times  . Arthritis    in back, hip, hands - not diagnosed per pt,   . Asthma    acute asthma per pt.   . Hyperlipidemia    on medications  . Hypertension   . Spondylolisthesis of  lumbar region 03/05/2018    Past Surgical History:  Procedure Laterality Date  . CESAREAN SECTION     x1  . PARTIAL HYSTERECTOMY    . TAH RSO 2012 for uterine fibroids      Family History  Problem Relation Age of Onset  . Hypertension Mother   . Breast cancer Mother 42  . Hypertension Father   . Hypertension Sister   . Hypertension Brother   . Hypertension Daughter   . Hypertension Son   . Hypertension Maternal Aunt   . Hypertension Maternal Uncle   . Hypertension Paternal Aunt   . Hypertension Paternal Uncle   . Hypertension Maternal Grandmother   . Hypertension Maternal Grandfather   . Hypertension Paternal Grandmother   . Hypertension Paternal Grandfather   . Colon cancer Neg Hx   . Colon polyps Neg Hx   . Esophageal cancer Neg Hx   . Rectal cancer Neg Hx   . Stomach cancer Neg Hx     Social History   Socioeconomic History  . Marital status: Divorced    Spouse name: Not on file  . Number of children: Not on file  . Years of education: Not on file  . Highest education level: Not on file  Occupational History  . Not on file  Tobacco Use  . Smoking status: Never Smoker  . Smokeless tobacco: Never Used  Vaping Use  .  Vaping Use: Never used  Substance and Sexual Activity  . Alcohol use: Yes    Alcohol/week: 0.0 standard drinks    Comment: social   . Drug use: No  . Sexual activity: Yes    Partners: Male  Other Topics Concern  . Not on file  Social History Narrative  . Not on file   Social Determinants of Health   Financial Resource Strain:   . Difficulty of Paying Living Expenses: Not on file  Food Insecurity:   . Worried About Charity fundraiser in the Last Year: Not on file  . Ran Out of Food in the Last Year: Not on file  Transportation Needs:   . Lack of Transportation (Medical): Not on file  . Lack of Transportation (Non-Medical): Not on file  Physical Activity:   . Days of Exercise per Week: Not on file  . Minutes of Exercise per Session:  Not on file  Stress:   . Feeling of Stress : Not on file  Social Connections:   . Frequency of Communication with Friends and Family: Not on file  . Frequency of Social Gatherings with Friends and Family: Not on file  . Attends Religious Services: Not on file  . Active Member of Clubs or Organizations: Not on file  . Attends Archivist Meetings: Not on file  . Marital Status: Not on file  Intimate Partner Violence:   . Fear of Current or Ex-Partner: Not on file  . Emotionally Abused: Not on file  . Physically Abused: Not on file  . Sexually Abused: Not on file     Current Outpatient Medications:  .  amLODipine (NORVASC) 10 MG tablet, Take 1 tablet by mouth once daily, Disp: 90 tablet, Rfl: 1 .  atorvastatin (LIPITOR) 20 MG tablet, TAKE 1 TABLET BY MOUTH ONCE DAILY . APPOINTMENT REQUIRED FOR FUTURE REFILLS, Disp: 7 tablet, Rfl: 0 .  carvedilol (COREG) 6.25 MG tablet, TAKE 1 TABLET BY MOUTH TWICE DAILY WITH A MEAL., Disp: 30 tablet, Rfl: 0 .  gabapentin (NEURONTIN) 100 MG capsule, , Disp: , Rfl:  .  irbesartan-hydrochlorothiazide (AVALIDE) 150-12.5 MG tablet, Take 2 tablets by mouth once daily, Disp: 60 tablet, Rfl: 2 .  nitroGLYCERIN (NITROSTAT) 0.4 MG SL tablet, Place 1 tablet (0.4 mg total) under the tongue every 5 (five) minutes as needed for chest pain (or tightness)., Disp: 30 tablet, Rfl: 0 .  pantoprazole (PROTONIX) 40 MG tablet, Take PO BID x2 additional weeks then change back to once daily., Disp: 45 tablet, Rfl: 3 .  potassium chloride SA (KLOR-CON) 20 MEQ tablet, Take 1 tablet (20 mEq total) by mouth daily., Disp: 90 tablet, Rfl: 3 .  amoxicillin-clavulanate (AUGMENTIN) 875-125 MG tablet, Take 1 tablet by mouth 2 (two) times daily., Disp: 20 tablet, Rfl: 0  Current Facility-Administered Medications:  .  0.9 %  sodium chloride infusion, 500 mL, Intravenous, Once, Jackquline Denmark, MD  EXAM:  VITALS per patient if applicable: BP (!) 161/096   Pulse 79   Wt 169 lb  (76.7 kg)   BMI 28.85 kg/m   GENERAL: alert, oriented, appears well and in no acute distress  HEENT: atraumatic, conjunttiva clear, no obvious abnormalities on inspection of external nose and ears  NECK: normal movements of the head and neck  LUNGS: on inspection no signs of respiratory distress, breathing rate appears normal, no obvious gross SOB, gasping or wheezing  CV: no obvious cyanosis  MS: moves all visible extremities without noticeable abnormality  PSYCH/NEURO:  pleasant and cooperative, no obvious depression or anxiety, speech and thought processing grossly intact  ASSESSMENT AND PLAN:  Discussed the following assessment and plan:  HTN (hypertension) BP elevated today.  Medications refilled as she has been out.  She is due to labs as well.  Orders entered.  Discussed avoiding decongestant, can use coricidin.   Acute maxillary sinusitis Start augmentin bid x10 days.  Recommend increased fluid intake and rest.   Contact clinic if not improving with prescribed treatment.    Meds ordered this encounter  Medications  . amoxicillin-clavulanate (AUGMENTIN) 875-125 MG tablet    Sig: Take 1 tablet by mouth 2 (two) times daily.    Dispense:  20 tablet    Refill:  0  . atorvastatin (LIPITOR) 20 MG tablet    Sig: TAKE 1 TABLET BY MOUTH ONCE DAILY .    Dispense:  90 tablet    Refill:  0  . carvedilol (COREG) 6.25 MG tablet    Sig: TAKE 1 TABLET BY MOUTH TWICE DAILY WITH A MEAL.    Dispense:  60 tablet    Refill:  0  . irbesartan-hydrochlorothiazide (AVALIDE) 150-12.5 MG tablet    Sig: Take 2 tablets by mouth daily.    Dispense:  60 tablet    Refill:  2  . amLODipine (NORVASC) 10 MG tablet    Sig: Take 1 tablet (10 mg total) by mouth daily.    Dispense:  90 tablet    Refill:  1   Orders Placed This Encounter  Procedures  . COMPLETE METABOLIC PANEL WITH GFR  . Lipid Profile      I discussed the assessment and treatment plan with the patient. The patient was  provided an opportunity to ask questions and all were answered. The patient agreed with the plan and demonstrated an understanding of the instructions.   The patient was advised to call back or seek an in-person evaluation if the symptoms worsen or if the condition fails to improve as anticipated.    Luetta Nutting, DO

## 2020-07-02 NOTE — Assessment & Plan Note (Signed)
BP elevated today.  Medications refilled as she has been out.  She is due to labs as well.  Orders entered.  Discussed avoiding decongestant, can use coricidin.

## 2020-07-02 NOTE — Assessment & Plan Note (Signed)
Start augmentin bid x10 days.  Recommend increased fluid intake and rest.   Contact clinic if not improving with prescribed treatment.

## 2020-07-02 NOTE — Progress Notes (Signed)
Symptoms started 1 week prior.  Sinus headache Left side of the face Swollen nasal passages  Pt has been taking Sudafed Sinus Headache.  Slight relief.3

## 2020-08-12 ENCOUNTER — Other Ambulatory Visit: Payer: Self-pay | Admitting: Family Medicine

## 2020-09-03 ENCOUNTER — Ambulatory Visit: Payer: BC Managed Care – PPO | Admitting: Family Medicine

## 2020-09-10 ENCOUNTER — Telehealth (INDEPENDENT_AMBULATORY_CARE_PROVIDER_SITE_OTHER): Payer: No Typology Code available for payment source | Admitting: Medical-Surgical

## 2020-09-10 ENCOUNTER — Encounter: Payer: Self-pay | Admitting: Medical-Surgical

## 2020-09-10 VITALS — BP 147/86 | HR 78

## 2020-09-10 DIAGNOSIS — J310 Chronic rhinitis: Secondary | ICD-10-CM | POA: Diagnosis not present

## 2020-09-10 DIAGNOSIS — J329 Chronic sinusitis, unspecified: Secondary | ICD-10-CM

## 2020-09-10 MED ORDER — DOXYCYCLINE HYCLATE 100 MG PO TABS: 100.0000 mg | ORAL_TABLET | Freq: Two times a day (BID) | ORAL | 0 refills | Status: AC

## 2020-09-10 MED ORDER — PREDNISONE 20 MG PO TABS: 20.0000 mg | ORAL_TABLET | Freq: Two times a day (BID) | ORAL | 0 refills | Status: DC

## 2020-09-10 MED ORDER — FLUCONAZOLE 150 MG PO TABS: 150.0000 mg | ORAL_TABLET | Freq: Once | ORAL | 0 refills | Status: AC

## 2020-09-10 MED ORDER — IPRATROPIUM BROMIDE 0.03 % NA SOLN
2.0000 | Freq: Two times a day (BID) | NASAL | 0 refills | Status: DC
Start: 2020-09-10 — End: 2020-10-01

## 2020-09-10 NOTE — Progress Notes (Signed)
Virtual Visit via Telephone   I connected with  Lindsay George  on 09/10/20 by telephone/telehealth and verified that I am speaking with the correct person using two identifiers.   I discussed the limitations, risks, security and privacy concerns of performing an evaluation and management service by telephone, including the higher likelihood of inaccurate diagnosis and treatment, and the availability of in person appointments.  We also discussed the likely need of an additional face to face encounter for complete and high quality delivery of care.  I also discussed with the patient that there may be a patient responsible charge related to this service. The patient expressed understanding and wishes to proceed.  Provider location is provider home due to inclement weather. Patient location is at their home, different from provider location. People involved in care of the patient during this telehealth encounter were myself, my nurse/medical assistant, and my front office/scheduling team member.  CC: sinus pressure  HPI: Pleasant 50 year old female presenting via telephone visit for continued sinus pressure. Was seen by Dr. Zigmund Daniel (PCP) on 07/02/2020 and prescribed a 10 day course of Augmentin. Took the medication which helped her symptoms but shortly after finishing the prescription, her symptoms returned although not quite as severe. She was seen at the [facility] where they prescribed her Amoxicillin. Again, her symptoms improved but then returned after completing the medication. Notes that she has pain and pressure in the left nostril along with pain behind the left eye which is causing a headache on the left side. Has some right maxillary tenderness but no dental pain. No other upper respiratory symptoms now but originally had ear pressure/pain. Has an intermittent runny nose but at times her nasal passages feel dry. Has used saline rinses intermittently which does help temporarily. No  fevers, shortness of breath, chest pain, or cough. Notes that every year she gets a bad sinus infection and usually gets an antibiotic with a steroid which clears it right up. This time she has not been prescribed a steroid.   Review of Systems: See HPI for pertinent positives and negatives.   Objective Findings:    General: Speaking full sentences, no audible heavy breathing.  Sounds alert and appropriately interactive.    Impression and Recommendations:    1. Rhinosinusitis Since she has seen improvement with antibiotics, we will try a 7 day course of Doxycycline to see if a stronger one will eradicate the infection. Prednisone 20mg  BID x 5 days. Atrovent nasal spray BID prn rhinorrhea. Consider using nasal saline sprays/rinses when nasal passages feel dry. Sending in [place] for yeast infection prophylaxis due to recent frequent antibiotics. If no improvement or if symptoms return after completing this round of medications, recommend returning for in person evaluation or possible referral to ENT.  I discussed the above assessment and treatment plan with the patient. The patient was provided an opportunity to ask questions and all were answered. The patient agreed with the plan and demonstrated an understanding of the instructions.   The patient was advised to call back or seek an in-person evaluation if the symptoms worsen or if the condition fails to improve as anticipated.  20 minutes of non-face-to-face time was provided during this encounter.  Return if symptoms worsen or fail to improve. ___________________________________________ Lindsay Bouche, DNP, APRN, FNP-BC [facility] [place]

## 2020-09-30 ENCOUNTER — Other Ambulatory Visit: Payer: Self-pay | Admitting: Medical-Surgical

## 2020-10-11 LAB — COMPLETE METABOLIC PANEL WITH GFR
AG Ratio: 1.6 (calc) (ref 1.0–2.5)
ALT: 17 U/L (ref 6–29)
AST: 13 U/L (ref 10–35)
Albumin: 4.3 g/dL (ref 3.6–5.1)
Alkaline phosphatase (APISO): 115 U/L (ref 37–153)
BUN: 12 mg/dL (ref 7–25)
CO2: 33 mmol/L — ABNORMAL HIGH (ref 20–32)
Calcium: 9.5 mg/dL (ref 8.6–10.4)
Chloride: 103 mmol/L (ref 98–110)
Creat: 0.82 mg/dL (ref 0.50–1.05)
GFR, Est African American: 96 mL/min/{1.73_m2} (ref >=60)
GFR, Est Non African American: 83 mL/min/{1.73_m2} (ref >=60)
Globulin: 2.7 g/dL (calc) (ref 1.9–3.7)
Glucose, Bld: 98 mg/dL (ref 65–139)
Potassium: 3 mmol/L — ABNORMAL LOW (ref 3.5–5.3)
Sodium: 142 mmol/L (ref 135–146)
Total Bilirubin: 0.5 mg/dL (ref 0.2–1.2)
Total Protein: 7 g/dL (ref 6.1–8.1)

## 2020-10-11 LAB — LIPID PANEL
Cholesterol: 156 mg/dL (ref <200)
HDL: 42 mg/dL — ABNORMAL LOW (ref >=50)
LDL Cholesterol (Calc): 86 mg/dL (calc)
Non-HDL Cholesterol (Calc): 114 mg/dL (calc) (ref <130)
Total CHOL/HDL Ratio: 3.7 (calc) (ref <5.0)
Triglycerides: 180 mg/dL — ABNORMAL HIGH (ref <150)

## 2020-10-12 ENCOUNTER — Ambulatory Visit (INDEPENDENT_AMBULATORY_CARE_PROVIDER_SITE_OTHER): Payer: No Typology Code available for payment source | Admitting: Medical-Surgical

## 2020-10-12 ENCOUNTER — Other Ambulatory Visit: Payer: Self-pay

## 2020-10-12 ENCOUNTER — Encounter: Payer: Self-pay | Admitting: Medical-Surgical

## 2020-10-12 VITALS — BP 119/74 | HR 75 | Temp 98.8°F | Ht 64.0 in | Wt 157.0 lb

## 2020-10-12 DIAGNOSIS — E876 Hypokalemia: Secondary | ICD-10-CM

## 2020-10-12 DIAGNOSIS — J329 Chronic sinusitis, unspecified: Secondary | ICD-10-CM | POA: Diagnosis not present

## 2020-10-12 MED ORDER — POTASSIUM CHLORIDE ER 10 MEQ PO TBCR: 10.0000 meq | EXTENDED_RELEASE_TABLET | Freq: Every day | ORAL | 0 refills | Status: DC

## 2020-10-12 MED ORDER — CETIRIZINE HCL 10 MG PO TABS: 10.0000 mg | ORAL_TABLET | Freq: Every day | ORAL | 11 refills | Status: AC

## 2020-10-12 MED ORDER — FLUTICASONE PROPIONATE 50 MCG/ACT NA SUSP: 2.0000 | Freq: Every day | NASAL | 3 refills | Status: AC

## 2020-10-12 NOTE — Progress Notes (Signed)
Subjective:    CC: sinus congestion, ear pain/pressure  HPI: Pleasant 50 year old female presenting for follow up on sinus congestion and ear concerns. She was originally seen in early November 2021 by her PCP and prescribed antibiotics. After that, she was seen by a [facility] and given another round of antibiotics. About 4 weeks ago, she saw me in a virtual visit and was given Doxycycline and a short burst of prednisone. Today, she reports that her symptoms did improve with the last round of treatment but she is still having intermittent problems with the left nasal passages swelling with sinus congestion, a left frontal headache, and left ear pain/pressure. Intermittently has "underwater" sounds in her ears. She has gone as long as 4-5 days without symptoms but they continue to return. Notes that congestions occurs before she develops a headache. Usually has issues with allergies but later in the year.  Denies hearing loss, tinnitus, fever, chills, shortness of breath, chest pain, and GI symptoms. Has been using Atrovent nasal spray at home which helps a little temporarily. Has a heater in her home office and wonders if the dry air is making it worse. Not taking a daily antihistamine or using a nasal steroid spray.   Has questions about her lab results and the instructions from her PCP. Would like to review that information so she is clear on the recommendations.   I reviewed the past medical history, family history, social history, surgical history, and allergies today and no changes were needed.  Please see the problem list section below in epic for further details.  Past Medical History: Past Medical History:  Diagnosis Date  . Allergy   . Anemia    in the past years ago   . Anxiety    at times  . Arthritis    in back, hip, hands - not diagnosed per pt,   . Asthma    acute asthma per pt.   . Hyperlipidemia    on medications  . Hypertension   . Spondylolisthesis of lumbar  region 03/05/2018   Past Surgical History: Past Surgical History:  Procedure Laterality Date  . CESAREAN SECTION     x1  . PARTIAL HYSTERECTOMY    . TAH RSO 2012 for uterine fibroids     Social History: Social History   Socioeconomic History  . Marital status: Divorced    Spouse name: Not on file  . Number of children: Not on file  . Years of education: Not on file  . Highest education level: Not on file  Occupational History  . Not on file  Tobacco Use  . Smoking status: Never Smoker  . Smokeless tobacco: Never Used  Vaping Use  . Vaping Use: Never used  Substance and Sexual Activity  . Alcohol use: Yes    Alcohol/week: 0.0 standard drinks    Comment: social   . Drug use: No  . Sexual activity: Yes    Partners: Male  Other Topics Concern  . Not on file  Social History Narrative  . Not on file   Social Determinants of Health   Financial Resource Strain: Not on file  Food Insecurity: Not on file  Transportation Needs: Not on file  Physical Activity: Not on file  Stress: Not on file  Social Connections: Not on file   Family History: Family History  Problem Relation Age of Onset  . Hypertension Mother   . Breast cancer Mother 87  . Hypertension Father   . Hypertension Sister   .  Hypertension Brother   . Hypertension Daughter   . Hypertension Son   . Hypertension Maternal Aunt   . Hypertension Maternal Uncle   . Hypertension Paternal Aunt   . Hypertension Paternal Uncle   . Hypertension Maternal Grandmother   . Hypertension Maternal Grandfather   . Hypertension Paternal Grandmother   . Hypertension Paternal Grandfather   . Colon cancer Neg Hx   . Colon polyps Neg Hx   . Esophageal cancer Neg Hx   . Rectal cancer Neg Hx   . Stomach cancer Neg Hx    Allergies: Allergies  Allergen Reactions  . Ace Inhibitors Cough    Cough   Medications: See med rec.  Review of Systems: See HPI for pertinent positives and negatives.   Objective:    General:  Well Developed, well nourished, and in no acute distress.  Neuro: Alert and oriented x3.  HEENT: Normocephalic, atraumatic, pupils equal round reactive to light, neck supple, no masses, no lymphadenopathy, thyroid nonpalpable. Wax in right ear canal blocking visualization of the TM. Left TM intact without erythema or bulging. Serous fluid behind left TM with no indication for infection.  Skin: Warm and dry. Cardiac: Regular rate and rhythm.  Respiratory: Not using accessory muscles, speaking in full sentences.   Impression and Recommendations:    1. Chronic congestion of paranasal sinus After 3 rounds of antibiotics without relief, further treatment with antibiotics not indicated. No current indication for repeat prednisone due to systemic effects. Recommend resuming a daily oral antihistamine. Start Flonase or similar nasal steroid spray. Consider a cool mist humidifier to use in the home office. Ok to use Atrovent as needed. Tylenol/Ibuprofen for headaches. Referring to ENT for further evaluation. If improved with the recommended treatment, she will cancel the ENT appointment.  - Ambulatory referral to ENT  2. Hypokalemia Chronic mild hypokalemia on her labs with the recommendation to start a potassium supplement. She has had to take these in the past and would like to have them prescribed. Starting Potassium chloride 53mEq daily. Will plan to have labs rechecked in 2-3 weeks as directed.    Return if symptoms worsen or fail to improve. ___________________________________________ Clearnce Sorrel, DNP, APRN, FNP-BC [facility] [place]

## 2020-10-26 ENCOUNTER — Other Ambulatory Visit: Payer: Self-pay | Admitting: Family Medicine

## 2020-11-03 ENCOUNTER — Other Ambulatory Visit: Payer: Self-pay | Admitting: Medical-Surgical

## 2020-11-05 NOTE — Telephone Encounter (Signed)
Originally supposed to have been sent from your last visit from her but wasn't so I sent it when she asked during her virtual visit. Routing to you for your evaluation.

## 2020-11-11 ENCOUNTER — Other Ambulatory Visit: Payer: Self-pay | Admitting: Family Medicine

## 2020-11-13 ENCOUNTER — Other Ambulatory Visit: Payer: Self-pay

## 2020-11-13 MED ORDER — PANTOPRAZOLE SODIUM 40 MG PO TBEC: DELAYED_RELEASE_TABLET | ORAL | 3 refills | Status: DC

## 2020-11-22 ENCOUNTER — Other Ambulatory Visit: Payer: Self-pay | Admitting: Family Medicine

## 2021-01-20 ENCOUNTER — Other Ambulatory Visit: Payer: Self-pay | Admitting: Family Medicine

## 2021-01-20 DIAGNOSIS — I10 Essential (primary) hypertension: Secondary | ICD-10-CM

## 2021-03-28 ENCOUNTER — Encounter: Payer: Self-pay | Admitting: Family Medicine

## 2021-03-28 ENCOUNTER — Ambulatory Visit (INDEPENDENT_AMBULATORY_CARE_PROVIDER_SITE_OTHER): Payer: No Typology Code available for payment source | Admitting: Family Medicine

## 2021-03-28 DIAGNOSIS — I10 Essential (primary) hypertension: Secondary | ICD-10-CM | POA: Diagnosis not present

## 2021-03-28 DIAGNOSIS — E785 Hyperlipidemia, unspecified: Secondary | ICD-10-CM | POA: Insufficient documentation

## 2021-03-28 DIAGNOSIS — E7800 Pure hypercholesterolemia, unspecified: Secondary | ICD-10-CM

## 2021-03-28 MED ORDER — IRBESARTAN-HYDROCHLOROTHIAZIDE 150-12.5 MG PO TABS: 2.0000 | ORAL_TABLET | Freq: Every day | ORAL | 3 refills | Status: DC

## 2021-03-28 MED ORDER — AMLODIPINE BESYLATE 10 MG PO TABS: 10.0000 mg | ORAL_TABLET | Freq: Every day | ORAL | 3 refills | Status: DC

## 2021-03-28 MED ORDER — PANTOPRAZOLE SODIUM 40 MG PO TBEC: DELAYED_RELEASE_TABLET | ORAL | 3 refills | Status: DC

## 2021-03-28 MED ORDER — ATORVASTATIN CALCIUM 20 MG PO TABS: 20.0000 mg | ORAL_TABLET | Freq: Every day | ORAL | 3 refills | Status: DC

## 2021-03-28 NOTE — Progress Notes (Signed)
Lindsay George - 50 y.o. female MRN [id number]  Date of birth: Aug 03, 1971  Subjective Chief Complaint  Patient presents with   Hypertension    HPI Lindsay George is a 50 y.o. female here today for follow up of HTN and HLD.  She has been out of irbesartan /hctz for several weeks.  She is still taking amlodipine and carvedilol.  No side effects related ot medication.  She denies chest pain, shortness of breath,palpitations,headache or vision changes.  She has tolerated atorvastatin well for management of HLD. No myalgias with this.  She is currently out of this medication.   She has felt a little more fatigued recently.  She is sleeping well and feels like she is getting good quality sleep.  She did change jobs recently, she likes her new job better.   ROS:  A comprehensive ROS was completed and negative except as noted per HPI  Allergies  Allergen Reactions   Ace Inhibitors Cough    Cough    Past Medical History:  Diagnosis Date   Allergy    Anemia    in the past years ago    Anxiety    at times   Arthritis    in back, hip, hands - not diagnosed per pt,    Asthma    acute asthma per pt.    Hyperlipidemia    on medications   Hypertension    Spondylolisthesis of lumbar region 03/05/2018    Past Surgical History:  Procedure Laterality Date   CESAREAN SECTION     x1   PARTIAL HYSTERECTOMY     TAH RSO 2012 for uterine fibroids      Social History   Socioeconomic History   Marital status: Divorced    Spouse name: Not on file   Number of children: Not on file   Years of education: Not on file   Highest education level: Not on file  Occupational History   Not on file  Tobacco Use   Smoking status: Never   Smokeless tobacco: Never  Vaping Use   Vaping Use: Never used  Substance and Sexual Activity   Alcohol use: Yes    Alcohol/week: 0.0 standard drinks    Comment: social    Drug use: No   Sexual activity: Yes    Partners: Male  Other Topics  Concern   Not on file  Social History Narrative   Not on file   Social Determinants of Health   Financial Resource Strain: Not on file  Food Insecurity: Not on file  Transportation Needs: Not on file  Physical Activity: Not on file  Stress: Not on file  Social Connections: Not on file    Family History  Problem Relation Age of Onset   Hypertension Mother    Breast cancer Mother 73   Hypertension Father    Hypertension Sister    Hypertension Brother    Hypertension Daughter    Hypertension Son    Hypertension Maternal Aunt    Hypertension Maternal Uncle    Hypertension Paternal Aunt    Hypertension Paternal Uncle    Hypertension Maternal Grandmother    Hypertension Maternal Grandfather    Hypertension Paternal Grandmother    Hypertension Paternal Grandfather    Colon cancer Neg Hx    Colon polyps Neg Hx    Esophageal cancer Neg Hx    Rectal cancer Neg Hx    Stomach cancer Neg Hx     Health Maintenance  Topic Date Due  HIV Screening  Never done   Hepatitis C Screening  Never done   Zoster Vaccines- Shingrix (1 of 2) Never done   COVID-19 Vaccine (3 - Booster for Pfizer series) 05/31/2020   INFLUENZA VACCINE  03/25/2021   COLONOSCOPY (Pts 45-12yr Insurance coverage will need to be confirmed)  04/06/2022   MAMMOGRAM  06/28/2022   TETANUS/TDAP  10/23/2027   Pneumococcal Vaccine [id number] old  Aged Out   HPV VACCINES  Aged Out     ----------------------------------------------------------------------------------------------------------------------------------------------------------------------------------------------------------------- Physical Exam BP (!) 144/89 (BP Location: Left Arm, Patient Position: Sitting, Cuff Size: Normal)   Pulse 75   Temp (!) 97.5 F (36.4 C)   Ht '5\' 4"'$  (1.626 m)   Wt 164 lb 4.8 oz (74.5 kg)   SpO2 100%   BMI 28.20 kg/m   Physical Exam Constitutional:      Appearance: Normal appearance.  Eyes:     General: No scleral  icterus. Musculoskeletal:     Cervical back: Neck supple.  Neurological:     General: No focal deficit present.     Mental Status: She is alert.  Psychiatric:        Mood and Affect: Mood normal.        Behavior: Behavior normal.    ------------------------------------------------------------------------------------------------------------------------------------------------------------------------------------------------------------------- Assessment and Plan  HTN (hypertension) Blood pressure is not at goal at for age and co-morbidities.  I recommend restarting irbesartan/hctz.  Continue amlodipine and carvedilol.   In addition they were instructed to follow a low sodium diet with regular exercise to help to maintain adequate control of blood pressure. Return 4-6 week for BP check at nurse visit.  6 month f/u with me.    HLD (hyperlipidemia) Lab Results  Component Value Date   LDLCALC 86 10/10/2020  She is tolerating atorvastatin well.  Refilled   Meds ordered this encounter  Medications   atorvastatin (LIPITOR) 20 MG tablet    Sig: Take 1 tablet (20 mg total) by mouth daily.    Dispense:  90 tablet    Refill:  3   irbesartan-hydrochlorothiazide (AVALIDE) 150-12.5 MG tablet    Sig: Take 2 tablets by mouth daily.    Dispense:  180 tablet    Refill:  3   pantoprazole (PROTONIX) 40 MG tablet    Sig: Take one tablet by mouth daily.    Dispense:  90 tablet    Refill:  3   amLODipine (NORVASC) 10 MG tablet    Sig: Take 1 tablet (10 mg total) by mouth daily.    Dispense:  90 tablet    Refill:  3    Return in about 6 months (around 09/28/2021) for HTN.    This visit occurred during the SARS-CoV-2 public health emergency.  Safety protocols were in place, including screening questions prior to the visit, additional usage of staff PPE, and extensive cleaning of exam room while observing appropriate contact time as indicated for disinfecting solutions.

## 2021-03-28 NOTE — Patient Instructions (Addendum)
Great to see you today! Restart medications for blood pressure.  See me again in about 6 months

## 2021-03-28 NOTE — Assessment & Plan Note (Signed)
Lab Results  Component Value Date   LDLCALC 86 10/10/2020  She is tolerating atorvastatin well.  Refilled

## 2021-03-28 NOTE — Assessment & Plan Note (Signed)
Blood pressure is not at goal at for age and co-morbidities.  I recommend restarting irbesartan/hctz.  Continue amlodipine and carvedilol.   In addition they were instructed to follow a low sodium diet with regular exercise to help to maintain adequate control of blood pressure. Return 4-6 week for BP check at nurse visit.  6 month f/u with me.

## 2021-05-07 ENCOUNTER — Other Ambulatory Visit: Payer: Self-pay | Admitting: Family Medicine

## 2021-06-06 ENCOUNTER — Ambulatory Visit: Payer: No Typology Code available for payment source | Admitting: Family Medicine

## 2022-01-12 ENCOUNTER — Other Ambulatory Visit: Payer: Self-pay | Admitting: Family Medicine

## 2022-01-13 NOTE — Telephone Encounter (Signed)
Patient has been scheduled for 02/26/22. AMUCK

## 2022-01-13 NOTE — Telephone Encounter (Signed)
Pls contact pt to schedule past due f80-monthfollow-up. Sending 30 day refill. Not seen since 8/22.

## 2022-02-26 ENCOUNTER — Ambulatory Visit: Payer: No Typology Code available for payment source | Admitting: Family Medicine

## 2022-03-19 ENCOUNTER — Other Ambulatory Visit: Payer: Self-pay | Admitting: Family Medicine

## 2022-03-20 ENCOUNTER — Ambulatory Visit: Payer: No Typology Code available for payment source | Admitting: Family Medicine

## 2022-03-20 ENCOUNTER — Encounter: Payer: Self-pay | Admitting: Family Medicine

## 2022-04-03 ENCOUNTER — Encounter: Payer: Self-pay | Admitting: Family Medicine

## 2022-04-03 ENCOUNTER — Telehealth (INDEPENDENT_AMBULATORY_CARE_PROVIDER_SITE_OTHER): Payer: No Typology Code available for payment source | Admitting: Family Medicine

## 2022-04-03 ENCOUNTER — Telehealth: Payer: Self-pay

## 2022-04-03 VITALS — Ht 64.0 in | Wt 157.0 lb

## 2022-04-03 DIAGNOSIS — E7800 Pure hypercholesterolemia, unspecified: Secondary | ICD-10-CM | POA: Diagnosis not present

## 2022-04-03 DIAGNOSIS — I10 Essential (primary) hypertension: Secondary | ICD-10-CM

## 2022-04-03 MED ORDER — AMLODIPINE BESYLATE 10 MG PO TABS: 10.0000 mg | ORAL_TABLET | Freq: Every day | ORAL | 1 refills | Status: DC

## 2022-04-03 MED ORDER — PANTOPRAZOLE SODIUM 40 MG PO TBEC: 40.0000 mg | DELAYED_RELEASE_TABLET | Freq: Every day | ORAL | 1 refills | Status: DC

## 2022-04-03 MED ORDER — IRBESARTAN-HYDROCHLOROTHIAZIDE 150-12.5 MG PO TABS: 2.0000 | ORAL_TABLET | Freq: Every day | ORAL | 1 refills | Status: DC

## 2022-04-03 MED ORDER — CARVEDILOL 6.25 MG PO TABS: 6.2500 mg | ORAL_TABLET | Freq: Two times a day (BID) | ORAL | 1 refills | Status: DC

## 2022-04-03 MED ORDER — ATORVASTATIN CALCIUM 20 MG PO TABS: 20.0000 mg | ORAL_TABLET | Freq: Every day | ORAL | 1 refills | Status: DC

## 2022-04-03 NOTE — Assessment & Plan Note (Signed)
Reports normal BP at home.  Medications renewed however she is instructed to stop in for a nurse visit for BP check and to update labs.

## 2022-04-03 NOTE — Progress Notes (Addendum)
Lindsay George - 51 y.o. female MRN [id number]  Date of birth: June 13, 1971    This format is felt to be most appropriate for this patient at this time.  All issues noted in this document were discussed and addressed.  No physical exam was performed (except for noted visual exam findings with Video Visits).  I discussed the limitations of evaluation and management by telemedicine and the availability of in person appointments. The patient expressed understanding and agreed to proceed.  I connected withNAME@ on 04/03/22 at  1:10 PM EDT by a video enabled telemedicine application and verified that I am speaking with the correct person using two identifiers.  Present at visit: Luetta Nutting, DO Dorthula Nettles   Patient Location: Home [place] [address]   Provider location:   [place]  No chief complaint on file.   HPI  Lindsay George is a 51 y.o. female who presents via audio/video conferencing for a telehealth visit today.  She is following up today for chronic conditions.  Unable to take off to come in for appt due to starting a new job.   She continues to do well with amlodipine, carvedilol and irbesartan/hctz for management of HTN.  She reports readings at home had looked ok.  Denies side effects from medication.  She has not had chest pain, shortness of breath, palpitations, headache or vision changes.   Tolerating atorvastatin well for HLD.    ROS:  A comprehensive ROS was completed and negative except as noted per HPI     Past Medical History:  Diagnosis Date   Allergy    Anemia    in the past years ago    Anxiety    at times   Arthritis    in back, hip, hands - not diagnosed per pt,    Asthma    acute asthma per pt.    Hyperlipidemia    on medications   Hypertension    Spondylolisthesis of lumbar region 03/05/2018    Past Surgical History:  Procedure Laterality Date   CESAREAN SECTION     x1   PARTIAL HYSTERECTOMY      TAH RSO 2012 for uterine fibroids      Family History  Problem Relation Age of Onset   Hypertension Mother    Breast cancer Mother 25   Hypertension Father    Hypertension Sister    Hypertension Brother    Hypertension Daughter    Hypertension Son    Hypertension Maternal Aunt    Hypertension Maternal Uncle    Hypertension Paternal Aunt    Hypertension Paternal Uncle    Hypertension Maternal Grandmother    Hypertension Maternal Grandfather    Hypertension Paternal Grandmother    Hypertension Paternal Grandfather    Colon cancer Neg Hx    Colon polyps Neg Hx    Esophageal cancer Neg Hx    Rectal cancer Neg Hx    Stomach cancer Neg Hx     Social History   Socioeconomic History   Marital status: Divorced    Spouse name: Not on file   Number of children: Not on file   Years of education: Not on file   Highest education level: Not on file  Occupational History   Not on file  Tobacco Use   Smoking status: Never   Smokeless tobacco: Never  Vaping Use   Vaping Use: Never used  Substance and Sexual Activity   Alcohol use: Yes  Alcohol/week: 0.0 standard drinks of alcohol    Comment: social    Drug use: No   Sexual activity: Yes    Partners: Male  Other Topics Concern   Not on file  Social History Narrative   Not on file   Social Determinants of Health   Financial Resource Strain: Not on file  Food Insecurity: Not on file  Transportation Needs: Not on file  Physical Activity: Not on file  Stress: Not on file  Social Connections: Not on file  Intimate Partner Violence: Not on file     Current Outpatient Medications:    cetirizine (ZYRTEC) 10 MG tablet, Take 1 tablet (10 mg total) by mouth daily., Disp: 30 tablet, Rfl: 11   cyclobenzaprine (FLEXERIL) 10 MG tablet, PLEASE SEE ATTACHED FOR DETAILED DIRECTIONS, Disp: , Rfl:    fluticasone (FLONASE) 50 MCG/ACT nasal spray, Place 2 sprays into both nostrils daily., Disp: 16 g, Rfl: 3   ipratropium (ATROVENT)  0.03 % nasal spray, PLACE 2 SPRAYS INTO BOTH NOSTRILS EVERY 12 (TWELVE) HOURS., Disp: 30 mL, Rfl: 1   nitroGLYCERIN (NITROSTAT) 0.4 MG SL tablet, Place 1 tablet (0.4 mg total) under the tongue every 5 (five) minutes as needed for chest pain (or tightness)., Disp: 30 tablet, Rfl: 0   potassium chloride (KLOR-CON) 10 MEQ tablet, TAKE 1 TABLET BY MOUTH EVERY DAY, Disp: 30 tablet, Rfl: 6   amLODipine (NORVASC) 10 MG tablet, Take 1 tablet (10 mg total) by mouth daily., Disp: 90 tablet, Rfl: 1   atorvastatin (LIPITOR) 20 MG tablet, Take 1 tablet (20 mg total) by mouth daily., Disp: 90 tablet, Rfl: 1   carvedilol (COREG) 6.25 MG tablet, Take 1 tablet (6.25 mg total) by mouth 2 (two) times daily with a meal., Disp: 180 tablet, Rfl: 1   irbesartan-hydrochlorothiazide (AVALIDE) 150-12.5 MG tablet, Take 2 tablets by mouth daily., Disp: 180 tablet, Rfl: 1   pantoprazole (PROTONIX) 40 MG tablet, Take 1 tablet (40 mg total) by mouth daily., Disp: 90 tablet, Rfl: 1  Current Facility-Administered Medications:    0.9 %  sodium chloride infusion, 500 mL, Intravenous, Once, Jackquline Denmark, MD  EXAM:  VITALS per patient if applicable: Ht '5\' 4"'$  (1.626 m)   Wt 157 lb (71.2 kg)   BMI 26.95 kg/m   GENERAL: alert, oriented, appears well and in no acute distress  HEENT: atraumatic, conjunttiva clear, no obvious abnormalities on inspection of external nose and ears  NECK: normal movements of the head and neck  LUNGS: on inspection no signs of respiratory distress, breathing rate appears normal, no obvious gross SOB, gasping or wheezing  CV: no obvious cyanosis  MS: moves all visible extremities without noticeable abnormality  PSYCH/NEURO: pleasant and cooperative, no obvious depression or anxiety, speech and thought processing grossly intact  ASSESSMENT AND PLAN:  Discussed the following assessment and plan:  HTN (hypertension) Reports normal BP at home.  Medications renewed however she is instructed to  stop in for a nurse visit for BP check and to update labs.    HLD (hyperlipidemia) Doing well with atorvastatin.  Continue at current strength.      I discussed the assessment and treatment plan with the patient. The patient was provided an opportunity to ask questions and all were answered. The patient agreed with the plan and demonstrated an understanding of the instructions.   The patient was advised to call back or seek an in-person evaluation if the symptoms worsen or if the condition fails to improve as anticipated.  Luetta Nutting, DO

## 2022-04-03 NOTE — Telephone Encounter (Signed)
Please advise patient that Dr. Zigmund Daniel schedule a Nurse visit for her tomorrow at 3pm. Thanks

## 2022-04-03 NOTE — Telephone Encounter (Signed)
Patient has been advised she has a Nurse visit scheduled for tomorrow at 3pm and said that works for her. AMUCK

## 2022-04-03 NOTE — Assessment & Plan Note (Signed)
Doing well with atorvastatin.  Continue at current strength.

## 2022-04-04 ENCOUNTER — Ambulatory Visit (INDEPENDENT_AMBULATORY_CARE_PROVIDER_SITE_OTHER): Payer: No Typology Code available for payment source | Admitting: Family Medicine

## 2022-04-04 VITALS — BP 133/73 | HR 74 | Temp 98.6°F | Ht 64.0 in | Wt 161.1 lb

## 2022-04-04 DIAGNOSIS — I10 Essential (primary) hypertension: Secondary | ICD-10-CM

## 2022-04-04 NOTE — Progress Notes (Signed)
Routing to an available provider.   Patient is here for blood pressure check. Denies trouble sleeping, palpitations, dizziness, lightheadedness, blurry vision, chest pain, shortness of breath, headaches and/or medication problems.   Patient's blood pressure was within goal range. Patient had lab order completed today. Patient informed to schedule their next appointment as informed by their provider.

## 2022-04-04 NOTE — Progress Notes (Signed)
Agree with documentation as above.   Montrice Gracey, MD  

## 2022-04-05 LAB — COMPLETE METABOLIC PANEL WITH GFR
AG Ratio: 1.7 (calc) (ref 1.0–2.5)
ALT: 14 U/L (ref 6–29)
AST: 13 U/L (ref 10–35)
Albumin: 4.2 g/dL (ref 3.6–5.1)
Alkaline phosphatase (APISO): 103 U/L (ref 37–153)
BUN: 9 mg/dL (ref 7–25)
CO2: 33 mmol/L — ABNORMAL HIGH (ref 20–32)
Calcium: 9.4 mg/dL (ref 8.6–10.4)
Chloride: 103 mmol/L (ref 98–110)
Creat: 0.8 mg/dL (ref 0.50–1.03)
Globulin: 2.5 g/dL (calc) (ref 1.9–3.7)
Glucose, Bld: 82 mg/dL (ref 65–139)
Potassium: 2.9 mmol/L — ABNORMAL LOW (ref 3.5–5.3)
Sodium: 142 mmol/L (ref 135–146)
Total Bilirubin: 0.5 mg/dL (ref 0.2–1.2)
Total Protein: 6.7 g/dL (ref 6.1–8.1)
eGFR: 88 mL/min/{1.73_m2} (ref >=60)

## 2022-04-05 LAB — CBC WITH DIFFERENTIAL/PLATELET
Absolute Monocytes: 317 cells/uL (ref 200–950)
Basophils Absolute: 28 cells/uL (ref 0–200)
Basophils Relative: 0.4 %
Eosinophils Absolute: 90 cells/uL (ref 15–500)
Eosinophils Relative: 1.3 %
HCT: 35.7 % (ref 35.0–45.0)
Hemoglobin: 12.3 g/dL (ref 11.7–15.5)
Lymphs Abs: 2815 cells/uL (ref 850–3900)
MCH: 28.8 pg (ref 27.0–33.0)
MCHC: 34.5 g/dL (ref 32.0–36.0)
MCV: 83.6 fL (ref 80.0–100.0)
MPV: 9.1 fL (ref 7.5–12.5)
Monocytes Relative: 4.6 %
Neutro Abs: 3650 cells/uL (ref 1500–7800)
Neutrophils Relative %: 52.9 %
Platelets: 325 10*3/uL (ref 140–400)
RBC: 4.27 10*6/uL (ref 3.80–5.10)
RDW: 17.4 % — ABNORMAL HIGH (ref 11.0–15.0)
Total Lymphocyte: 40.8 %
WBC: 6.9 10*3/uL (ref 3.8–10.8)

## 2022-04-05 LAB — LIPID PANEL W/REFLEX DIRECT LDL
Cholesterol: 142 mg/dL (ref <200)
HDL: 49 mg/dL — ABNORMAL LOW (ref >=50)
LDL Cholesterol (Calc): 74 mg/dL (calc)
Non-HDL Cholesterol (Calc): 93 mg/dL (calc) (ref <130)
Total CHOL/HDL Ratio: 2.9 (calc) (ref <5.0)
Triglycerides: 103 mg/dL (ref <150)

## 2022-04-10 ENCOUNTER — Encounter: Payer: Self-pay | Admitting: Gastroenterology

## 2022-04-11 ENCOUNTER — Other Ambulatory Visit: Payer: Self-pay | Admitting: Family Medicine

## 2022-04-11 DIAGNOSIS — I10 Essential (primary) hypertension: Secondary | ICD-10-CM

## 2022-04-11 MED ORDER — POTASSIUM CHLORIDE ER 20 MEQ PO TBCR: 20.0000 meq | EXTENDED_RELEASE_TABLET | Freq: Every day | ORAL | 6 refills | Status: DC

## 2022-10-07 ENCOUNTER — Other Ambulatory Visit: Payer: Self-pay | Admitting: Family Medicine

## 2022-10-11 ENCOUNTER — Other Ambulatory Visit: Payer: Self-pay | Admitting: Family Medicine

## 2022-12-10 ENCOUNTER — Encounter: Payer: Self-pay | Admitting: *Deleted

## 2023-01-05 ENCOUNTER — Other Ambulatory Visit: Payer: Self-pay | Admitting: Family Medicine

## 2023-01-05 DIAGNOSIS — I10 Essential (primary) hypertension: Secondary | ICD-10-CM

## 2023-01-06 NOTE — Telephone Encounter (Signed)
Called patient, LVM to call office to schedule appointment for med refills, thanks.

## 2023-01-06 NOTE — Telephone Encounter (Signed)
Please contact the patient for appt. Unable to refill medication since pt last seen August 2023. Out of meds since February. Thanks

## 2023-03-02 ENCOUNTER — Ambulatory Visit (INDEPENDENT_AMBULATORY_CARE_PROVIDER_SITE_OTHER): Payer: No Typology Code available for payment source | Admitting: Family Medicine

## 2023-03-02 ENCOUNTER — Encounter: Payer: Self-pay | Admitting: Family Medicine

## 2023-03-02 VITALS — BP 115/83 | HR 75 | Ht 64.0 in

## 2023-03-02 DIAGNOSIS — I10 Essential (primary) hypertension: Secondary | ICD-10-CM

## 2023-03-02 DIAGNOSIS — Z1231 Encounter for screening mammogram for malignant neoplasm of breast: Secondary | ICD-10-CM | POA: Diagnosis not present

## 2023-03-02 DIAGNOSIS — E7800 Pure hypercholesterolemia, unspecified: Secondary | ICD-10-CM

## 2023-03-02 DIAGNOSIS — Z1211 Encounter for screening for malignant neoplasm of colon: Secondary | ICD-10-CM | POA: Diagnosis not present

## 2023-03-02 DIAGNOSIS — M25561 Pain in right knee: Secondary | ICD-10-CM

## 2023-03-02 MED ORDER — MELOXICAM 15 MG PO TABS: 15.0000 mg | ORAL_TABLET | Freq: Every day | ORAL | 0 refills | Status: DC

## 2023-03-02 MED ORDER — CARVEDILOL 6.25 MG PO TABS: 6.2500 mg | ORAL_TABLET | Freq: Two times a day (BID) | ORAL | 1 refills | Status: DC

## 2023-03-02 MED ORDER — IRBESARTAN-HYDROCHLOROTHIAZIDE 150-12.5 MG PO TABS: 2.0000 | ORAL_TABLET | Freq: Every day | ORAL | 1 refills | Status: AC

## 2023-03-02 MED ORDER — AMLODIPINE BESYLATE 10 MG PO TABS: 10.0000 mg | ORAL_TABLET | Freq: Every day | ORAL | 1 refills | Status: DC

## 2023-03-02 MED ORDER — PANTOPRAZOLE SODIUM 40 MG PO TBEC: 40.0000 mg | DELAYED_RELEASE_TABLET | Freq: Every day | ORAL | 1 refills | Status: DC

## 2023-03-02 MED ORDER — ATORVASTATIN CALCIUM 20 MG PO TABS: 20.0000 mg | ORAL_TABLET | Freq: Every day | ORAL | 3 refills | Status: DC

## 2023-03-02 NOTE — Patient Instructions (Signed)
Have labs completed when fasting.  Continue current medication.  Let me know if knee is not improving with meloxicam Follow up in 6 months.

## 2023-03-02 NOTE — Assessment & Plan Note (Signed)
Stable with atorvastatin.  Update lipid panel ordered.

## 2023-03-02 NOTE — Progress Notes (Signed)
Lindsay George - 52 y.o. female MRN [id number]  Date of birth: 28-Jun-1971  Subjective Chief Complaint  Patient presents with   Hypertension   Knee Pain    HPI Lindsay George is a 52 year old female here today for follow-up visit.  Her blood pressure has been well-controlled with combination of Avalide, amlodipine and carvedilol.  She reports she is doing well with these medications.  She denies side effects at this time.  She has not had any chest pain, shortness of breath, palpitations, headaches or vision changes.  Remains on atorvastatin and is tolerating this well.  She is having some right knee pain.  She has been moving things around her house recently.  Pain is worse in the back of the knee.  She does have some swelling.  She has not tried anything so far than icing.  She is overdue for colon cancer screening.  She is due for mammogram as well.  ROS:  A comprehensive ROS was completed and negative except as noted per HPI  Allergies  Allergen Reactions   Ace Inhibitors Cough    Cough    Past Medical History:  Diagnosis Date   Allergy    Anemia    in the past years ago    Anxiety    at times   Arthritis    in back, hip, hands - not diagnosed per pt,    Asthma    acute asthma per pt.    Hyperlipidemia    on medications   Hypertension    Spondylolisthesis of lumbar region 03/05/2018    Past Surgical History:  Procedure Laterality Date   CESAREAN SECTION     x1   PARTIAL HYSTERECTOMY     TAH RSO 2012 for uterine fibroids      Social History   Socioeconomic History   Marital status: Divorced    Spouse name: Not on file   Number of children: Not on file   Years of education: Not on file   Highest education level: Not on file  Occupational History   Not on file  Tobacco Use   Smoking status: Never   Smokeless tobacco: Never  Vaping Use   Vaping Use: Never used  Substance and Sexual Activity   Alcohol use: Yes    Alcohol/week: 0.0  standard drinks of alcohol    Comment: social    Drug use: No   Sexual activity: Yes    Partners: Male  Other Topics Concern   Not on file  Social History Narrative   Not on file   Social Determinants of Health   Financial Resource Strain: Not on file  Food Insecurity: Not on file  Transportation Needs: Not on file  Physical Activity: Not on file  Stress: Not on file  Social Connections: Not on file    Family History  Problem Relation Age of Onset   Hypertension Mother    Breast cancer Mother 41   Hypertension Father    Hypertension Sister    Hypertension Brother    Hypertension Daughter    Hypertension Son    Hypertension Maternal Aunt    Hypertension Maternal Uncle    Hypertension Paternal Aunt    Hypertension Paternal Uncle    Hypertension Maternal Grandmother    Hypertension Maternal Grandfather    Hypertension Paternal Grandmother    Hypertension Paternal Grandfather    Colon cancer Neg Hx    Colon polyps Neg Hx    Esophageal cancer Neg Hx  Rectal cancer Neg Hx    Stomach cancer Neg Hx     Health Maintenance  Topic Date Due   MAMMOGRAM  06/28/2022   COVID-19 Vaccine (3 - 2023-24 season) 03/18/2023 (Originally 04/25/2022)   Hepatitis C Screening  04/04/2023 (Originally 12/09/1986)   HIV Screening  04/04/2023 (Originally 12/09/1983)   Colonoscopy  06/02/2023 (Originally 04/06/2022)   Zoster Vaccines- Shingrix (1 of 2) 06/02/2023 (Originally 12/09/2018)   INFLUENZA VACCINE  03/26/2023   DTaP/Tdap/Td (2 - Td or Tdap) 10/23/2027   HPV VACCINES  Aged Out     ----------------------------------------------------------------------------------------------------------------------------------------------------------------------------------------------------------------- Physical Exam BP 115/83   Pulse 75   Ht 5\' 4"  (1.626 m)   SpO2 100%   BMI 27.66 kg/m   Physical Exam Constitutional:      Appearance: Normal appearance.  HENT:     Head: Normocephalic and  atraumatic.  Eyes:     General: No scleral icterus. Cardiovascular:     Rate and Rhythm: Normal rate and regular rhythm.  Pulmonary:     Effort: Pulmonary effort is normal.     Breath sounds: Normal breath sounds.  Musculoskeletal:     Comments: Right knee effusion noted.  Mild tenderness along the posterior knee.  No significant tenderness along the joint line or with compression of the patella.  Neurological:     General: No focal deficit present.     Mental Status: She is alert.  Psychiatric:        Mood and Affect: Mood normal.        Behavior: Behavior normal.     ------------------------------------------------------------------------------------------------------------------------------------------------------------------------------------------------------------------- Assessment and Plan  HTN (hypertension) Blood pressure mains well-controlled.  Continue current medications for management of hypertension.  Updated labs ordered.  HLD (hyperlipidemia) Stable with atorvastatin.  Update lipid panel ordered.  Right knee pain Added meloxicam daily.  She will let me know if not improving with this.   Meds ordered this encounter  Medications   amLODipine (NORVASC) 10 MG tablet    Sig: Take 1 tablet (10 mg total) by mouth daily.    Dispense:  90 tablet    Refill:  1   atorvastatin (LIPITOR) 20 MG tablet    Sig: Take 1 tablet (20 mg total) by mouth daily.    Dispense:  90 tablet    Refill:  3   carvedilol (COREG) 6.25 MG tablet    Sig: Take 1 tablet (6.25 mg total) by mouth 2 (two) times daily with a meal.    Dispense:  180 tablet    Refill:  1   irbesartan-hydrochlorothiazide (AVALIDE) 150-12.5 MG tablet    Sig: Take 2 tablets by mouth daily.    Dispense:  180 tablet    Refill:  1   pantoprazole (PROTONIX) 40 MG tablet    Sig: Take 1 tablet (40 mg total) by mouth daily.    Dispense:  90 tablet    Refill:  1   meloxicam (MOBIC) 15 MG tablet    Sig: Take 1 tablet  (15 mg total) by mouth daily.    Dispense:  30 tablet    Refill:  0    No follow-ups on file.    This visit occurred during the SARS-CoV-2 public health emergency.  Safety protocols were in place, including screening questions prior to the visit, additional usage of staff PPE, and extensive cleaning of exam room while observing appropriate contact time as indicated for disinfecting solutions.

## 2023-03-02 NOTE — Assessment & Plan Note (Signed)
Added meloxicam daily.  She will let me know if not improving with this.

## 2023-03-02 NOTE — Assessment & Plan Note (Signed)
Blood pressure mains well-controlled.  Continue current medications for management of hypertension.  Updated labs ordered.

## 2023-03-03 ENCOUNTER — Ambulatory Visit: Payer: No Typology Code available for payment source | Admitting: Family Medicine

## 2023-03-20 ENCOUNTER — Other Ambulatory Visit: Payer: Self-pay | Admitting: Family Medicine

## 2023-03-24 ENCOUNTER — Encounter: Payer: Self-pay | Admitting: Gastroenterology

## 2023-04-03 ENCOUNTER — Other Ambulatory Visit: Payer: Self-pay | Admitting: Family Medicine

## 2023-04-03 MED ORDER — POTASSIUM CHLORIDE ER 20 MEQ PO TBCR: 20.0000 meq | EXTENDED_RELEASE_TABLET | Freq: Every day | ORAL | 6 refills | Status: DC

## 2023-05-06 ENCOUNTER — Ambulatory Visit: Payer: No Typology Code available for payment source

## 2023-05-22 ENCOUNTER — Encounter: Payer: Self-pay | Admitting: Gastroenterology

## 2023-05-22 ENCOUNTER — Ambulatory Visit (AMBULATORY_SURGERY_CENTER): Payer: No Typology Code available for payment source | Admitting: *Deleted

## 2023-05-22 VITALS — Ht 64.0 in | Wt 155.0 lb

## 2023-05-22 DIAGNOSIS — Z86010 Personal history of colonic polyps: Secondary | ICD-10-CM

## 2023-05-22 MED ORDER — SUTAB 1479-225-188 MG PO TABS: 1.0000 | ORAL_TABLET | ORAL | 0 refills | Status: DC

## 2023-05-22 NOTE — Progress Notes (Signed)
Pt's name and DOB verified at the beginning of the pre-visit.  Pt denies any difficulty with ambulating,sitting, laying down or rolling side to side Gave both [facility] main # and MD on call # prior to instructions.  No egg or soy allergy known to patient  No issues known to pt with past sedation with any surgeries or procedures Pt denies having issues being intubated Patient denies ever being intubated Pt has no issues moving head neck or swallowing No FH of Malignant Hyperthermia Pt is not on diet pills Pt is not on home 02  Pt is not on blood thinners  Pt denies issues with constipation  Pt is not on dialysis Pt denise any abnormal heart rhythms  Pt denies any upcoming cardiac testing Pt encouraged to use to use Singlecare or Goodrx to reduce cost  Patient's chart reviewed by Cathlyn Parsons CNRA prior to pre-visit and patient appropriate for the [facility].  Pre-visit completed and red dot placed by patient's name on their procedure day (on provider's schedule).  . Visit by phone Pt states weight is 155 lb Instructed pt why it is important to and  to call if they have any changes in health or new medications. Directed them to the # given and on instructions.   Pt states they will.  Instructions reviewed with pt and pt states understanding. Instructed to review again prior to procedure. Pt states they will.  Pt desires sutab RN stressed intake is important with doing this prep. Pt states she understood.

## 2023-06-05 ENCOUNTER — Ambulatory Visit: Payer: No Typology Code available for payment source | Admitting: Gastroenterology

## 2023-06-05 ENCOUNTER — Encounter: Payer: Self-pay | Admitting: Gastroenterology

## 2023-06-05 ENCOUNTER — Ambulatory Visit: Payer: No Typology Code available for payment source

## 2023-06-05 ENCOUNTER — Ambulatory Visit
Admission: RE | Admit: 2023-06-05 | Discharge: 2023-06-05 | Disposition: A | Discharge status: Home / Self Care | Payer: No Typology Code available for payment source | Source: Ambulatory Visit | Attending: Family Medicine | Admitting: Family Medicine

## 2023-06-05 VITALS — BP 137/74 | HR 65 | Temp 98.9°F | Resp 15 | Ht 64.0 in | Wt 155.0 lb

## 2023-06-05 DIAGNOSIS — Z09 Encounter for follow-up examination after completed treatment for conditions other than malignant neoplasm: Secondary | ICD-10-CM

## 2023-06-05 DIAGNOSIS — D122 Benign neoplasm of ascending colon: Secondary | ICD-10-CM

## 2023-06-05 DIAGNOSIS — Z860100 Personal history of colon polyps, unspecified: Secondary | ICD-10-CM | POA: Diagnosis not present

## 2023-06-05 DIAGNOSIS — Z1231 Encounter for screening mammogram for malignant neoplasm of breast: Secondary | ICD-10-CM

## 2023-06-05 DIAGNOSIS — K635 Polyp of colon: Secondary | ICD-10-CM

## 2023-06-05 DIAGNOSIS — K5140 Inflammatory polyps of colon without complications: Secondary | ICD-10-CM | POA: Diagnosis not present

## 2023-06-05 DIAGNOSIS — D125 Benign neoplasm of sigmoid colon: Secondary | ICD-10-CM

## 2023-06-05 MED ORDER — SODIUM CHLORIDE 0.9 % IV SOLN: 500.0000 mL | Freq: Once | INTRAVENOUS | Status: DC

## 2023-06-05 NOTE — Progress Notes (Signed)
Uneventful anesthetic. Report to pacu rn. Vss. Care resumed by rn. 

## 2023-06-05 NOTE — Progress Notes (Signed)
VS by SS  Pt's states no medical or surgical changes since previsit or office visit.  

## 2023-06-05 NOTE — Progress Notes (Signed)
Called to room to assist during endoscopic procedure.  Patient ID and intended procedure confirmed with present staff. Received instructions for my participation in the procedure from the performing physician.  

## 2023-06-05 NOTE — Progress Notes (Signed)
[facility] History and Physical   Primary Care Physician:  Everrett Coombe, DO   Reason for Procedure:    history of polyps  Plan:     colonoscopy     HPI: Lindsay George is a 52 y.o. female    Past Medical History:  Diagnosis Date   Allergy    Anemia    in the past years ago    Anxiety    at times   Arthritis    in back, hip, hands - not diagnosed per pt,    Asthma    acute asthma per pt.    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 Authorizing Provider  amLODipine (NORVASC) 10 MG tablet Take 1 tablet (10 mg total) by mouth daily. 03/02/23  Yes Everrett Coombe, DO  atorvastatin (LIPITOR) 20 MG tablet Take 1 tablet (20 mg total) by mouth daily. 03/02/23  Yes Everrett Coombe, DO  carvedilol (COREG) 6.25 MG tablet Take 1 tablet (6.25 mg total) by mouth 2 (two) times daily with a meal. 03/02/23  Yes Everrett Coombe, DO  irbesartan-hydrochlorothiazide (AVALIDE) 150-12.5 MG tablet Take 2 tablets by mouth daily. 03/02/23  Yes Everrett Coombe, DO  pantoprazole (PROTONIX) 40 MG tablet Take 1 tablet (40 mg total) by mouth daily. 03/02/23  Yes Everrett Coombe, DO  Potassium Chloride ER 20 MEQ TBCR Take 1 tablet (20 mEq total) by mouth daily. 04/03/23  Yes Everrett Coombe, DO  cetirizine (ZYRTEC) 10 MG tablet Take 1 tablet (10 mg total) by mouth daily. Patient not taking: Reported on 05/22/2023 10/12/20   Christen Butter, NP  cyclobenzaprine (FLEXERIL) 10 MG tablet PLEASE SEE ATTACHED FOR DETAILED DIRECTIONS 09/29/20   [provider]  fluticasone (FLONASE) 50 MCG/ACT nasal spray Place 2 sprays into both nostrils daily. 10/12/20   Christen Butter, NP  ipratropium  (ATROVENT) 0.03 % nasal spray PLACE 2 SPRAYS INTO BOTH NOSTRILS EVERY 12 (TWELVE) HOURS. 10/26/20   Christen Butter, NP  meloxicam (MOBIC) 15 MG tablet Take 1 tablet (15 mg total) by mouth daily. 03/02/23   Everrett Coombe, DO  nitroGLYCERIN (NITROSTAT) 0.4 MG SL tablet Place 1 tablet (0.4 mg total) under the tongue every 5 (five) minutes as needed for chest pain (or tightness). Patient not taking: Reported on 05/22/2023 05/26/19   Rodolph Bong, MD    Current Outpatient Medications  Medication Sig Dispense Refill   amLODipine (NORVASC) 10 MG tablet Take 1 tablet (10 mg total) by mouth daily. 90 tablet 1   atorvastatin (LIPITOR) 20 MG tablet Take 1 tablet (20 mg total) by mouth daily. 90 tablet 3   carvedilol (COREG) 6.25 MG tablet Take 1 tablet (6.25 mg total) by mouth 2 (two) times daily with a meal. 180 tablet 1   irbesartan-hydrochlorothiazide (AVALIDE) 150-12.5 MG tablet Take 2 tablets by mouth daily. 180 tablet 1   pantoprazole (PROTONIX) 40 MG tablet Take 1 tablet (40 mg total) by mouth daily. 90 tablet 1   Potassium Chloride ER 20 MEQ TBCR Take 1 tablet (20 mEq total) by mouth daily. 30 tablet 6  cetirizine (ZYRTEC) 10 MG tablet Take 1 tablet (10 mg total) by mouth daily. (Patient not taking: Reported on 05/22/2023) 30 tablet 11   cyclobenzaprine (FLEXERIL) 10 MG tablet PLEASE SEE ATTACHED FOR DETAILED DIRECTIONS     fluticasone (FLONASE) 50 MCG/ACT nasal spray Place 2 sprays into both nostrils daily. 16 g 3   ipratropium (ATROVENT) 0.03 % nasal spray PLACE 2 SPRAYS INTO BOTH NOSTRILS EVERY 12 (TWELVE) HOURS. 30 mL 1   meloxicam (MOBIC) 15 MG tablet Take 1 tablet (15 mg total) by mouth daily. 30 tablet 0   nitroGLYCERIN (NITROSTAT) 0.4 MG SL tablet Place 1 tablet (0.4 mg total) under the tongue every 5 (five) minutes as needed for chest pain (or tightness). (Patient not taking: Reported on 05/22/2023) 30 tablet 0   Current Facility-Administered Medications  Medication Dose Route Frequency Provider  Last Rate Last Admin   0.9 %  sodium chloride infusion  500 mL Intravenous Once Lynann Bologna, MD        Allergies as of 06/05/2023 - Review Complete 06/05/2023  Allergen Reaction Noted   Ace inhibitors Cough 05/31/2015    Family History  Problem Relation Age of Onset   Hypertension Mother    Breast cancer Mother 72   Hypertension Father    Hypertension Sister    Hypertension Brother    Hypertension Daughter    Hypertension Son    Hypertension Maternal Aunt    Hypertension Maternal Uncle    Hypertension Paternal Aunt    Hypertension Paternal Uncle    Hypertension Maternal Grandmother    Hypertension Maternal Grandfather    Hypertension Paternal Grandmother    Hypertension Paternal Grandfather    Colon cancer Neg Hx    Colon polyps Neg Hx    Esophageal cancer Neg Hx    Rectal cancer Neg Hx    Stomach cancer Neg Hx     Social History   Socioeconomic History   Marital status: Divorced    Spouse name: Not on file   Number of children: Not on file   Years of education: Not on file   Highest education level: Not on file  Occupational History   Not on file  Tobacco Use   Smoking status: Never   Smokeless tobacco: Never  Vaping Use   Vaping status: Never Used  Substance and Sexual Activity   Alcohol use: Yes    Alcohol/week: 0.0 standard drinks of alcohol    Comment: social    Drug use: No   Sexual activity: Yes    Partners: Male  Other Topics Concern   Not on file  Social History Narrative   Not on file   Social Determinants of Health   Financial Resource Strain: Not on file  Food Insecurity: Not on file  Transportation Needs: Not on file  Physical Activity: Not on file  Stress: Not on file  Social Connections: Unknown (01/03/2022)   Received from [facility], [facility]   Social Network    Social Network: Not on file  Intimate Partner Violence: Unknown (11/26/2021)   Received from [facility], [facility]   HITS    Physically Hurt: Not on file     Insult or Talk Down To: Not on file    Threaten Physical Harm: Not on file    Scream or Curse: Not on file    Review of Systems: Positive for nopne All other review of systems negative except as mentioned in the HPI.  Physical Exam: Vital signs in last 24  hours: @VSRANGES @   General:   Alert,  Well-developed, well-nourished, pleasant and cooperative in NAD Lungs:  Clear throughout to auscultation.   Heart:  Regular rate and rhythm; no murmurs, clicks, rubs,  or gallops. Abdomen:  Soft, nontender and nondistended. Normal bowel sounds.   Neuro/Psych:  Alert and cooperative. Normal mood and affect. A and O x 3    No significant changes were identified.  The patient continues to be an appropriate candidate for the planned procedure and anesthesia.   Edman Circle, MD. [facility] 06/05/2023 9:44 AM@

## 2023-06-05 NOTE — Patient Instructions (Signed)
Resume previous diet and medications. Awaiting pathology results. Repeat Colonoscopy date to be determined based on pathology results.  YOU HAD AN ENDOSCOPIC PROCEDURE TODAY AT THE [facility]:   Refer to the procedure report that was given to you for any specific questions about what was found during the examination.  If the procedure report does not answer your questions, please call your gastroenterologist to clarify.  If you requested that your care partner not be given the details of your procedure findings, then the procedure report has been included in a sealed envelope for you to review at your convenience later.  YOU SHOULD EXPECT: Some feelings of bloating in the abdomen. Passage of more gas than usual.  Walking can help get rid of the air that was put into your GI tract during the procedure and reduce the bloating. If you had a lower endoscopy (such as a colonoscopy or flexible sigmoidoscopy) you may notice spotting of blood in your stool or on the toilet paper. If you underwent a bowel prep for your procedure, you may not have a normal bowel movement for a few days.  Please Note:  You might notice some irritation and congestion in your nose or some drainage.  This is from the oxygen used during your procedure.  There is no need for concern and it should clear up in a day or so.  SYMPTOMS TO REPORT IMMEDIATELY:  Following lower endoscopy (colonoscopy or flexible sigmoidoscopy):  Excessive amounts of blood in the stool  Significant tenderness or worsening of abdominal pains  Swelling of the abdomen that is new, acute  Fever of 100F or higher   For urgent or emergent issues, a gastroenterologist can be reached at any hour by calling [phone]. Do not use MyChart messaging for urgent concerns.    DIET:  We do recommend a small meal at first, but then you may proceed to your regular diet.  Drink plenty of fluids but you should avoid alcoholic beverages for 24  hours.  ACTIVITY:  You should plan to take it easy for the rest of today and you should NOT DRIVE or use heavy machinery until tomorrow (because of the sedation medicines used during the test).    FOLLOW UP: Our staff will call the number listed on your records the next business day following your procedure.  We will call around 7:15- 8:00 am to check on you and address any questions or concerns that you may have regarding the information given to you following your procedure. If we do not reach you, we will leave a message.     If any biopsies were taken you will be contacted by phone or by letter within the next 1-3 weeks.  Please call us at [phone] if you have not heard about the biopsies in 3 weeks.    SIGNATURES/CONFIDENTIALITY: You and/or your care partner have signed paperwork which will be entered into your electronic medical record.  These signatures attest to the fact that that the information above on your After Visit Summary has been reviewed and is understood.  Full responsibility of the confidentiality of this discharge information lies with you and/or your care-partner. 

## 2023-06-05 NOTE — Op Note (Signed)
[facility] Patient Name: Lindsay George Procedure Date: 06/05/2023 9:38 AM MRN: [id number] Endoscopist: Lynann Bologna , MD, [id number] Age: 52 Referring MD:  Date of Birth: March 03, 1971 Gender: Female Account #: [id number] Procedure:                Colonoscopy Indications:              High risk colon cancer surveillance: Personal                            history of colonic polyps Medicines:                Monitored Anesthesia Care Procedure:                Pre-Anesthesia Assessment:                           - Prior to the procedure, a History and Physical                            was performed, and patient medications and                            allergies were reviewed. The patient's tolerance of                            previous anesthesia was also reviewed. The risks                            and benefits of the procedure and the sedation                            options and risks were discussed with the patient.                            All questions were answered, and informed consent                            was obtained. Prior Anticoagulants: The patient has                            taken no anticoagulant or antiplatelet agents. ASA                            Grade Assessment: II - A patient with mild systemic                            disease. After reviewing the risks and benefits,                            the patient was deemed in satisfactory condition to                            undergo the procedure.  After obtaining informed consent, the colonoscope                            was passed under direct vision. Throughout the                            procedure, the patient's blood pressure, pulse, and                            oxygen saturations were monitored continuously. The                            [id number]-HQ190L Colonoscope [id number] was introduced                            through the anus and advanced to the 2  cm into the                            ileum. The colonoscopy was performed without                            difficulty. The patient tolerated the procedure                            well. The quality of the bowel preparation was                            good. The terminal ileum, ileocecal valve,                            appendiceal orifice, and rectum were photographed. Scope In: 9:51:24 AM Scope Out: 10:06:37 AM Scope Withdrawal Time: 0 hours 12 minutes 30 seconds  Total Procedure Duration: 0 hours 15 minutes 13 seconds  Findings:                 Three sessile polyps were found in the mid sigmoid                            colon, distal sigmoid colon and proximal ascending                            colon. The polyps were 3 to 4 mm in size. These                            polyps were removed with a cold snare. Resection                            and retrieval were complete.                           A few small-mouthed diverticula were found in the                            sigmoid colon.  Non-bleeding internal hemorrhoids were found during                            retroflexion. The hemorrhoids were small and Grade                            I (internal hemorrhoids that do not prolapse).                           The terminal ileum appeared normal.                           The exam was otherwise without abnormality on                            direct and retroflexion views. Complications:            No immediate complications. Estimated Blood Loss:     Estimated blood loss: none. Impression:               - Three 3 to 4 mm polyps in the mid sigmoid colon,                            in the distal sigmoid colon and in the proximal                            ascending colon, removed with a cold snare.                            Resected and retrieved.                           - Minimal sigmoid diverticulosis.                           -  Non-bleeding internal hemorrhoids.                           - The examined portion of the ileum was normal.                           - The examination was otherwise normal on direct                            and retroflexion views. Recommendation:           - Patient has a contact number available for                            emergencies. The signs and symptoms of potential                            delayed complications were discussed with the                            patient. Return to normal activities  tomorrow.                            Written discharge instructions were provided to the                            patient.                           - Resume previous diet.                           - Continue present medications.                           - Await pathology results.                           - Repeat colonoscopy for surveillance depending                            upon the above biopsy results                           - The findings and recommendations were discussed                            with the patient's family. Lynann Bologna, MD 06/05/2023 10:11:24 AM This report has been signed electronically.

## 2023-06-08 ENCOUNTER — Telehealth: Payer: Self-pay | Admitting: *Deleted

## 2023-06-08 NOTE — Telephone Encounter (Signed)
  Follow up Call-     06/05/2023    9:31 AM  Call back number  Post procedure Call Back phone  # [phone]  Permission to leave phone message Yes     Patient questions:  Do you have a fever, pain , or abdominal swelling? No. Pain Score  0 *  Have you tolerated food without any problems? Yes.    Have you been able to return to your normal activities? Yes.    Do you have any questions about your discharge instructions: Diet   No. Medications  No. Follow up visit  No.  Do you have questions or concerns about your Care? No.  Actions: * If pain score is 4 or above: No action needed, pain <4.

## 2023-06-10 LAB — SURGICAL PATHOLOGY

## 2023-06-11 ENCOUNTER — Encounter: Payer: Self-pay | Admitting: Gastroenterology

## 2023-07-15 ENCOUNTER — Other Ambulatory Visit: Payer: Self-pay | Admitting: Family Medicine

## 2023-08-14 ENCOUNTER — Other Ambulatory Visit: Payer: Self-pay | Admitting: Family Medicine

## 2023-10-10 ENCOUNTER — Other Ambulatory Visit: Payer: Self-pay | Admitting: Family Medicine

## 2023-10-13 NOTE — Telephone Encounter (Signed)
 Spoke with patient, patient states that she will call back to schedule appointment, thanks.

## 2023-10-13 NOTE — Telephone Encounter (Signed)
 Pls contact pt for 81-month appt. Due in Feb. 2025. Sending 30 day refill. Thx

## 2023-12-08 ENCOUNTER — Other Ambulatory Visit: Payer: Self-pay | Admitting: Family Medicine

## 2023-12-20 ENCOUNTER — Other Ambulatory Visit: Payer: Self-pay | Admitting: Family Medicine

## 2023-12-22 NOTE — Telephone Encounter (Signed)
 Pls contact pt to schedule HTN appt with Dr. Augustus Ledger. Past due 6 month follow-up. Sending 15 day refill. Thx

## 2023-12-22 NOTE — Telephone Encounter (Signed)
Called patient, LVM to call office to schedule 6 month follow up, thanks.

## 2024-01-09 ENCOUNTER — Other Ambulatory Visit: Payer: Self-pay | Admitting: Family Medicine

## 2024-01-30 ENCOUNTER — Other Ambulatory Visit: Payer: Self-pay | Admitting: Family Medicine

## 2024-02-15 ENCOUNTER — Other Ambulatory Visit: Payer: Self-pay | Admitting: Family Medicine

## 2024-02-15 MED ORDER — CARVEDILOL 6.25 MG PO TABS
6.2500 mg | ORAL_TABLET | Freq: Two times a day (BID) | ORAL | 0 refills | Status: DC
Start: 2024-02-15 — End: 2024-03-04

## 2024-02-15 MED ORDER — MELOXICAM 15 MG PO TABS: 15.0000 mg | ORAL_TABLET | Freq: Every day | ORAL | 0 refills | Status: AC

## 2024-02-15 MED ORDER — AMLODIPINE BESYLATE 10 MG PO TABS
10.0000 mg | ORAL_TABLET | Freq: Every day | ORAL | 0 refills | Status: DC
Start: 2024-02-15 — End: 2024-03-04

## 2024-02-15 NOTE — Telephone Encounter (Signed)
 Copied from CRM [id number]. Topic: Clinical - Medication Refill >> Feb 15, 2024  9:00 AM Laurier C wrote: Medication:  meloxicam  (MOBIC ) 15 MG tablet carvedilol  (COREG ) 6.25 MG tablet amLODipine  (NORVASC ) 10 MG tablet   Has the patient contacted their pharmacy? No (Agent: If no, request that the patient contact the pharmacy for the refill. If patient does not wish to contact the pharmacy document the reason why and proceed with request.) (Agent: If yes, when and what did the pharmacy advise?)  This is the patient's preferred pharmacy:  [facility] [phone] - [place], [place] - [address] [place] [place]  [place] [address] Phone: [phone] Fax: [phone]  Is this the correct pharmacy for this prescription? Yes If no, delete pharmacy and type the correct one.   Has the prescription been filled recently? Yes  Is the patient out of the medication? Yes  Has the patient been seen for an appointment in the last year OR does the patient have an upcoming appointment? Yes  Can we respond through MyChart? No  Agent: Please be advised that Rx refills may take up to 3 business days. We ask that you follow-up with your pharmacy.

## 2024-02-25 ENCOUNTER — Ambulatory Visit (INDEPENDENT_AMBULATORY_CARE_PROVIDER_SITE_OTHER): Admitting: Family Medicine

## 2024-02-25 ENCOUNTER — Encounter: Payer: Self-pay | Admitting: Family Medicine

## 2024-02-25 VITALS — BP 113/70 | HR 74 | Ht 64.0 in | Wt 161.0 lb

## 2024-02-25 DIAGNOSIS — E7800 Pure hypercholesterolemia, unspecified: Secondary | ICD-10-CM | POA: Diagnosis not present

## 2024-02-25 DIAGNOSIS — Z0000 Encounter for general adult medical examination without abnormal findings: Secondary | ICD-10-CM | POA: Insufficient documentation

## 2024-02-25 DIAGNOSIS — I10 Essential (primary) hypertension: Secondary | ICD-10-CM | POA: Diagnosis not present

## 2024-02-25 DIAGNOSIS — Z114 Encounter for screening for human immunodeficiency virus [HIV]: Secondary | ICD-10-CM | POA: Diagnosis not present

## 2024-02-25 DIAGNOSIS — Z1159 Encounter for screening for other viral diseases: Secondary | ICD-10-CM

## 2024-02-25 NOTE — Patient Instructions (Signed)
 Preventive Care 16-55 Years Old, Female  Preventive care refers to lifestyle choices and visits with your health care provider that can promote health and wellness. Preventive care visits are also called wellness exams.  What can I expect for my preventive care visit?  Counseling  Your health care provider may ask you questions about your:  Medical history, including:  Past medical problems.  Family medical history.  Pregnancy history.  Current health, including:  Menstrual cycle.  Method of birth control.  Emotional well-being.  Home life and relationship well-being.  Sexual activity and sexual health.  Lifestyle, including:  Alcohol, nicotine or tobacco, and drug use.  Access to firearms.  Diet, exercise, and sleep habits.  Work and work Astronomer.  Sunscreen use.  Safety issues such as seatbelt and bike helmet use.  Physical exam  Your health care provider will check your:  Height and weight. These may be used to calculate your BMI (body mass index). BMI is a measurement that tells if you are at a healthy weight.  Waist circumference. This measures the distance around your waistline. This measurement also tells if you are at a healthy weight and may help predict your risk of certain diseases, such as type 2 diabetes and high blood pressure.  Heart rate and blood pressure.  Body temperature.  Skin for abnormal spots.  What immunizations do I need?    Vaccines are usually given at various ages, according to a schedule. Your health care provider will recommend vaccines for you based on your age, medical history, and lifestyle or other factors, such as travel or where you work.  What tests do I need?  Screening  Your health care provider may recommend screening tests for certain conditions. This may include:  Lipid and cholesterol levels.  Diabetes screening. This is done by checking your blood sugar (glucose) after you have not eaten for a while (fasting).  Pelvic exam and Pap test.  Hepatitis B test.  Hepatitis C  test.  HIV (human immunodeficiency virus) test.  STI (sexually transmitted infection) testing, if you are at risk.  Lung cancer screening.  Colorectal cancer screening.  Mammogram. Talk with your health care provider about when you should start having regular mammograms. This may depend on whether you have a family history of breast cancer.  BRCA-related cancer screening. This may be done if you have a family history of breast, ovarian, tubal, or peritoneal cancers.  Bone density scan. This is done to screen for osteoporosis.  Talk with your health care provider about your test results, treatment options, and if necessary, the need for more tests.  Follow these instructions at home:  Eating and drinking    Eat a diet that includes fresh fruits and vegetables, whole grains, lean protein, and low-fat dairy products.  Take vitamin and mineral supplements as recommended by your health care provider.  Do not drink alcohol if:  Your health care provider tells you not to drink.  You are pregnant, may be pregnant, or are planning to become pregnant.  If you drink alcohol:  Limit how much you have to 0-1 drink a day.  Know how much alcohol is in your drink. In the [place] one drink equals one 12 oz bottle of beer (355 mL), one 5 oz glass of wine (148 mL), or one 1 oz glass of hard liquor (44 mL).  Lifestyle  Brush your teeth every morning and night with fluoride toothpaste. Floss one time each day.  Exercise for at least  30 minutes 5 or more days each week.  Do not use any products that contain nicotine or tobacco. These products include cigarettes, chewing tobacco, and vaping devices, such as e-cigarettes. If you need help quitting, ask your health care provider.  Do not use drugs.  If you are sexually active, practice safe sex. Use a condom or other form of protection to prevent STIs.  If you do not wish to become pregnant, use a form of birth control. If you plan to become pregnant, see your health care provider for a  prepregnancy visit.  Take aspirin only as told by your health care provider. Make sure that you understand how much to take and what form to take. Work with your health care provider to find out whether it is safe and beneficial for you to take aspirin daily.  Find healthy ways to manage stress, such as:  Meditation, yoga, or listening to music.  Journaling.  Talking to a trusted person.  Spending time with friends and family.  Minimize exposure to UV radiation to reduce your risk of skin cancer.  Safety  Always wear your seat belt while driving or riding in a vehicle.  Do not drive:  If you have been drinking alcohol. Do not ride with someone who has been drinking.  When you are tired or distracted.  While texting.  If you have been using any mind-altering substances or drugs.  Wear a helmet and other protective equipment during sports activities.  If you have firearms in your house, make sure you follow all gun safety procedures.  Seek help if you have been physically or sexually abused.  What's next?  Visit your health care provider once a year for an annual wellness visit.  Ask your health care provider how often you should have your eyes and teeth checked.  Stay up to date on all vaccines.  This information is not intended to replace advice given to you by your health care provider. Make sure you discuss any questions you have with your health care provider.  Document Revised: 02/06/2021 Document Reviewed: 02/06/2021  Elsevier Patient Education  2024 [employer].

## 2024-02-25 NOTE — Assessment & Plan Note (Addendum)
 Well adult Orders Placed This Encounter  Procedures   CMP14+EGFR   CBC with Differential/Platelet   Lipid Panel With LDL/HDL Ratio   HIV antibody (with reflex)   Hepatitis C Antibody  Screenings; per lab orders Immunizations:  Declines at this time.  Anticipatory guidance/Risk factor reduction:  Recommendations per AVS

## 2024-02-25 NOTE — Progress Notes (Signed)
 Lindsay George - 53 y.o. female MRN [id number]  Date of birth: February 02, 1971  Subjective Chief Complaint  Patient presents with   Annual Exam    HPI Lindsay George is a 53 y.o. female here today for annual exam.   She reports that she is doing pretty well.    She is moderately active and feels that diet is pretty good.    She is a non-smoker.  No EtOH.   Review of Systems  Constitutional:  Negative for chills, fever, malaise/fatigue and weight loss.  HENT:  Negative for congestion, ear pain and sore throat.   Eyes:  Negative for blurred vision, double vision and pain.  Respiratory:  Negative for cough and shortness of breath.   Cardiovascular:  Negative for chest pain and palpitations.  Gastrointestinal:  Negative for abdominal pain, blood in stool, constipation, heartburn and nausea.  Genitourinary:  Negative for dysuria and urgency.  Musculoskeletal:  Negative for joint pain and myalgias.  Neurological:  Negative for dizziness and headaches.  Endo/Heme/Allergies:  Does not bruise/bleed easily.  Psychiatric/Behavioral:  Negative for depression. The patient is not nervous/anxious and does not have insomnia.     Allergies  Allergen Reactions   Ace Inhibitors Cough    Cough    Past Medical History:  Diagnosis Date   Allergy    Anemia    in the past years ago    Anxiety    at times   Arthritis    in back, hip, hands - not diagnosed per pt,    Asthma    acute asthma per pt.    Hyperlipidemia    on medications   Hypertension    Spondylolisthesis of lumbar region 03/05/2018    Past Surgical History:  Procedure Laterality Date   CESAREAN SECTION     x1   COLONOSCOPY     LUMBAR DISC SURGERY     PARTIAL HYSTERECTOMY     TAH RSO 2012 for uterine fibroids      Social History   Socioeconomic History   Marital status: Divorced    Spouse name: Not on file   Number of children: Not on file   Years of education: Not on file   Highest education level: Not  on file  Occupational History   Not on file  Tobacco Use   Smoking status: Never   Smokeless tobacco: Never  Vaping Use   Vaping status: Never Used  Substance and Sexual Activity   Alcohol use: Yes    Alcohol/week: 0.0 standard drinks of alcohol    Comment: social    Drug use: No   Sexual activity: Yes    Partners: Male  Other Topics Concern   Not on file  Social History Narrative   Not on file   Social Drivers of Health   Financial Resource Strain: Not on file  Food Insecurity: Not on file  Transportation Needs: Not on file  Physical Activity: Not on file  Stress: Not on file  Social Connections: Unknown (01/03/2022)   Received from [facility]   Social Network    Social Network: Not on file    Family History  Problem Relation Age of Onset   Hypertension Mother    Breast cancer Mother 75   Hypertension Father    Hypertension Sister    Hypertension Brother    Hypertension Daughter    Hypertension Son    Hypertension Maternal Aunt    Hypertension Maternal Uncle    Hypertension Paternal Engineer, mining  Hypertension Paternal Uncle    Hypertension Maternal Grandmother    Hypertension Maternal Grandfather    Hypertension Paternal Grandmother    Hypertension Paternal Grandfather    Colon cancer Neg Hx    Colon polyps Neg Hx    Esophageal cancer Neg Hx    Rectal cancer Neg Hx    Stomach cancer Neg Hx     Health Maintenance  Topic Date Due   HIV Screening  Never done   Hepatitis C Screening  Never done   Pneumococcal Vaccine 47-85 Years old (1 of 2 - PCV) Never done   Hepatitis B Vaccines (1 of 3 - 19+ 3-dose series) Never done   Zoster Vaccines- Shingrix (1 of 2) Never done   COVID-19 Vaccine (3 - 2024-25 season) 04/26/2023   INFLUENZA VACCINE  03/25/2024   MAMMOGRAM  06/04/2025   DTaP/Tdap/Td (2 - Td or Tdap) 10/23/2027   Colonoscopy  06/04/2033   HPV VACCINES  Aged Out   Meningococcal B Vaccine  Aged Out      ----------------------------------------------------------------------------------------------------------------------------------------------------------------------------------------------------------------- Physical Exam BP 113/70 (BP Location: Left Arm, Patient Position: Sitting, Cuff Size: Normal)   Pulse 74   Ht 5' 4 (1.626 m)   Wt 161 lb (73 kg)   SpO2 98%   BMI 27.64 kg/m   Physical Exam Constitutional:      General: She is not in acute distress. HENT:     Head: Normocephalic and atraumatic.     Right Ear: Tympanic membrane and ear canal normal.     Left Ear: Tympanic membrane and ear canal normal.     Nose: Nose normal.  Eyes:     General: No scleral icterus.    Conjunctiva/sclera: Conjunctivae normal.  Neck:     Thyroid: No thyromegaly.  Cardiovascular:     Rate and Rhythm: Normal rate and regular rhythm.     Heart sounds: Normal heart sounds.  Pulmonary:     Effort: Pulmonary effort is normal.     Breath sounds: Normal breath sounds.  Abdominal:     General: Bowel sounds are normal. There is no distension.     Palpations: Abdomen is soft.     Tenderness: There is no abdominal tenderness. There is no guarding.  Musculoskeletal:        General: Normal range of motion.     Cervical back: Normal range of motion and neck supple.  Lymphadenopathy:     Cervical: No cervical adenopathy.  Skin:    General: Skin is warm and dry.     Findings: No rash.  Neurological:     General: No focal deficit present.     Mental Status: She is alert and oriented to person, place, and time.     Cranial Nerves: No cranial nerve deficit.     Coordination: Coordination normal.  Psychiatric:        Mood and Affect: Mood normal.        Behavior: Behavior normal.      ------------------------------------------------------------------------------------------------------------------------------------------------------------------------------------------------------------------- Assessment and Plan  Well adult exam Well adult Orders Placed This Encounter  Procedures   CMP14+EGFR   CBC with Differential/Platelet   Lipid Panel With LDL/HDL Ratio   HIV antibody (with reflex)   Hepatitis C Antibody  Screenings; per lab orders Immunizations:  Declines at this time.  Anticipatory guidance/Risk factor reduction:  Recommendations per AVS    No orders of the defined types were placed in this encounter.   No follow-ups on file.

## 2024-02-26 LAB — CMP14+EGFR
ALT: 13 IU/L (ref 0–32)
AST: 10 IU/L (ref 0–40)
Albumin: 4.1 g/dL (ref 3.8–4.9)
Alkaline Phosphatase: 115 IU/L (ref 44–121)
BUN/Creatinine Ratio: 14 (ref 9–23)
BUN: 15 mg/dL (ref 6–24)
Bilirubin Total: 0.2 mg/dL (ref 0.0–1.2)
CO2: 24 mmol/L (ref 20–29)
Calcium: 9.2 mg/dL (ref 8.7–10.2)
Chloride: 103 mmol/L (ref 96–106)
Creatinine, Ser: 1.07 mg/dL — ABNORMAL HIGH (ref 0.57–1.00)
Globulin, Total: 2.6 g/dL (ref 1.5–4.5)
Glucose: 84 mg/dL (ref 70–99)
Potassium: 3.3 mmol/L — ABNORMAL LOW (ref 3.5–5.2)
Sodium: 143 mmol/L (ref 134–144)
Total Protein: 6.7 g/dL (ref 6.0–8.5)
eGFR: 61 mL/min/1.73 (ref >59)

## 2024-02-26 LAB — CBC WITH DIFFERENTIAL/PLATELET
Basophils Absolute: 0 x10E3/uL (ref 0.0–0.2)
Basos: 1 %
EOS (ABSOLUTE): 0.1 x10E3/uL (ref 0.0–0.4)
Eos: 1 %
Hematocrit: 34.7 % (ref 34.0–46.6)
Hemoglobin: 11.2 g/dL (ref 11.1–15.9)
Immature Grans (Abs): 0 x10E3/uL (ref 0.0–0.1)
Immature Granulocytes: 0 %
Lymphocytes Absolute: 3.5 x10E3/uL — ABNORMAL HIGH (ref 0.7–3.1)
Lymphs: 41 %
MCH: 28.6 pg (ref 26.6–33.0)
MCHC: 32.3 g/dL (ref 31.5–35.7)
MCV: 89 fL (ref 79–97)
Monocytes Absolute: 0.5 x10E3/uL (ref 0.1–0.9)
Monocytes: 6 %
Neutrophils Absolute: 4.4 x10E3/uL (ref 1.4–7.0)
Neutrophils: 51 %
Platelets: 391 x10E3/uL (ref 150–450)
RBC: 3.91 x10E6/uL (ref 3.77–5.28)
RDW: 16.4 % — ABNORMAL HIGH (ref 11.7–15.4)
WBC: 8.6 x10E3/uL (ref 3.4–10.8)

## 2024-02-26 LAB — HEPATITIS C ANTIBODY: Hep C Virus Ab: NONREACTIVE

## 2024-02-26 LAB — LIPID PANEL WITH LDL/HDL RATIO
Cholesterol, Total: 163 mg/dL (ref 100–199)
HDL: 44 mg/dL (ref >39)
LDL Chol Calc (NIH): 76 mg/dL (ref 0–99)
LDL/HDL Ratio: 1.7 ratio (ref 0.0–3.2)
Triglycerides: 262 mg/dL — ABNORMAL HIGH (ref 0–149)
VLDL Cholesterol Cal: 43 mg/dL — ABNORMAL HIGH (ref 5–40)

## 2024-02-26 LAB — HIV ANTIBODY (ROUTINE TESTING W REFLEX): HIV Screen 4th Generation wRfx: NONREACTIVE

## 2024-03-02 ENCOUNTER — Ambulatory Visit: Payer: Self-pay | Admitting: Family Medicine

## 2024-03-03 ENCOUNTER — Other Ambulatory Visit: Payer: Self-pay | Admitting: Family Medicine

## 2024-03-03 DIAGNOSIS — I10 Essential (primary) hypertension: Secondary | ICD-10-CM

## 2024-03-03 DIAGNOSIS — K219 Gastro-esophageal reflux disease without esophagitis: Secondary | ICD-10-CM

## 2024-03-03 DIAGNOSIS — E7800 Pure hypercholesterolemia, unspecified: Secondary | ICD-10-CM

## 2024-03-04 ENCOUNTER — Telehealth: Payer: Self-pay

## 2024-03-04 NOTE — Telephone Encounter (Signed)
 I have attempted to contact patient but unfortunately I was sent to voicemail once again. I have left detailed voice message regarding lab results per patient request.

## 2024-03-04 NOTE — Telephone Encounter (Signed)
 Copied from CRM [id number]. Topic: Clinical - Lab/Test Results >> Mar 04, 2024 11:04 AM Lindsay George wrote: Reason for CRM: Patient returning missed call regarding lab results, please call patient back, can leave information regarding results on voicemail.

## 2024-04-29 ENCOUNTER — Other Ambulatory Visit: Payer: Self-pay | Admitting: Family Medicine

## 2024-06-08 ENCOUNTER — Other Ambulatory Visit: Payer: Self-pay | Admitting: Family Medicine

## 2024-06-08 DIAGNOSIS — I10 Essential (primary) hypertension: Secondary | ICD-10-CM

## 2024-07-18 ENCOUNTER — Ambulatory Visit: Payer: Self-pay

## 2024-07-18 NOTE — Telephone Encounter (Signed)
 FYI Only or Action Required?: FYI only for provider: appointment scheduled on 07/19/2024 at 10:50am with patient's PCP Dr Velma Ku.  Patient was last seen in primary care on 02/25/2024 by Ku Velma, DO.  Called Nurse Triage reporting Back Pain.  Symptoms began a week ago.  Interventions attempted: OTC medications: tylenol , Rest, hydration, or home remedies, and Ice/heat application.  Symptoms are: gradually worsening.  Triage Disposition: See PCP When Office is Open (Within 3 Days)  Patient/caregiver understands and will follow disposition?: Yes                      Copied from CRM #[id number]. Topic: Clinical - Red Word Triage >> Jul 18, 2024  7:42 AM Lindsay George wrote: Red Word that prompted transfer to Nurse Triage: Patient is having major back pain and the otc medication is not working. Had back surgery a few years ago and hasn't had any problems, but recently started experiencing the pain. Reason for Disposition . [1] MODERATE back pain (e.g., interferes with normal activities) AND [2] present > 3 days  Answer Assessment - Initial Assessment Questions Offered patient an appointment this morning(07/18/2024) but she had to work and states she can come tomorrow (07/19/2024)  Patient is advised to call us  back if anything changes or with any further questions/concerns. Patient is advised that if anything worsens to go to the Emergency Room. Patient verbalized understanding.    1. ONSET: When did the pain begin? (e.g., minutes, hours, days)     Middle of last week 2. LOCATION: Where does it hurt? (upper, mid or lower back)     Lower and to the right and into the right hip 5. RADIATION: Does the pain shoot into your legs or somewhere else?     Into right hip and down right leg 6. CAUSE:  What do you think is causing the back pain?      Maybe sitting too long at work (patient states she works from home 7. BACK OVERUSE:  Any recent lifting of  heavy objects, strenuous work or exercise?     No--just daily use 8. MEDICINES: What have you taken so far for the pain? (e.g., nothing, acetaminophen , NSAIDS)     tylenol  9. NEUROLOGIC SYMPTOMS: Do you have any weakness, numbness, or problems with bowel/bladder control?     denies 10. OTHER SYMPTOMS: Do you have any other symptoms? (e.g., fever, abdomen pain, burning with urination, blood in urine)       denies  Protocols used: Back Pain-A-AH

## 2024-07-19 ENCOUNTER — Ambulatory Visit (INDEPENDENT_AMBULATORY_CARE_PROVIDER_SITE_OTHER)

## 2024-07-19 ENCOUNTER — Encounter: Payer: Self-pay | Admitting: Urgent Care

## 2024-07-19 ENCOUNTER — Ambulatory Visit: Admitting: Family Medicine

## 2024-07-19 ENCOUNTER — Ambulatory Visit: Payer: Self-pay | Admitting: Urgent Care

## 2024-07-19 ENCOUNTER — Ambulatory Visit (INDEPENDENT_AMBULATORY_CARE_PROVIDER_SITE_OTHER): Admitting: Urgent Care

## 2024-07-19 VITALS — BP 133/82 | HR 80 | Ht 64.0 in | Wt 169.0 lb

## 2024-07-19 DIAGNOSIS — R112 Nausea with vomiting, unspecified: Secondary | ICD-10-CM

## 2024-07-19 DIAGNOSIS — M5441 Lumbago with sciatica, right side: Secondary | ICD-10-CM

## 2024-07-19 DIAGNOSIS — B079 Viral wart, unspecified: Secondary | ICD-10-CM | POA: Diagnosis not present

## 2024-07-19 MED ORDER — BACLOFEN 10 MG PO TABS
10.0000 mg | ORAL_TABLET | Freq: Three times a day (TID) | ORAL | 0 refills | Status: AC
Start: 2024-07-19 — End: ?

## 2024-07-19 MED ORDER — ONDANSETRON 4 MG PO TBDP: 8.0000 mg | ORAL_TABLET | Freq: Once | ORAL | Status: AC

## 2024-07-19 MED ORDER — PREDNISONE 10 MG (21) PO TBPK: ORAL_TABLET | Freq: Every day | ORAL | 0 refills | Status: AC

## 2024-07-19 NOTE — Progress Notes (Unsigned)
 Established Patient Office Visit  Subjective:  Patient ID: Lindsay George, female    DOB: Apr 11, 1971  Age: 53 y.o. MRN: [id number]  Chief Complaint  Patient presents with   Back Pain    HPI  Patient Active Problem List   Diagnosis Date Noted   Well adult exam 02/25/2024   Right knee pain 03/02/2023   HLD (hyperlipidemia) 03/28/2021   GERD (gastroesophageal reflux disease) 12/22/2019   Atypical chest pain 12/11/2019   Bifascicular bundle branch block 05/26/2019   Spinal stenosis at L4-L5 level 03/25/2018   Facet arthritis of lumbar region 03/25/2018   Spondylolisthesis of lumbar region 03/05/2018   Cough variant asthma 08/06/2017   Insomnia 08/15/2016   Abnormal mammogram of left breast 06/24/2016   Keloid scar 05/31/2015   Bursitis of left hip 05/11/2015   HTN (hypertension) 04/19/2015   Lumbago 04/19/2015   History of hysterectomy 04/19/2015   Breast cancer screening 04/19/2015   Past Medical History:  Diagnosis Date   Allergy    Anemia    in the past years ago    Anxiety    at times   Arthritis    in back, hip, hands - not diagnosed per pt,    Asthma    acute asthma per pt.    Hyperlipidemia    on medications   Hypertension    Spondylolisthesis of lumbar region 03/05/2018   Past Surgical History:  Procedure Laterality Date   CESAREAN SECTION     x1   COLONOSCOPY     LUMBAR DISC SURGERY     PARTIAL HYSTERECTOMY     TAH RSO 2012 for uterine fibroids     Social History   Tobacco Use   Smoking status: Never   Smokeless tobacco: Never  Vaping Use   Vaping status: Never Used  Substance Use Topics   Alcohol use: Yes    Alcohol/week: 0.0 standard drinks of alcohol    Comment: social    Drug use: No      ROS: as noted in HPI  Objective:     BP 133/82   Pulse 80   Ht 5' 4 (1.626 m)   Wt 169 lb (76.7 kg)   SpO2 100%   BMI 29.01 kg/m  BP Readings from Last 3 Encounters:  07/19/24 133/82  02/25/24 113/70  06/05/23 137/74   Wt  Readings from Last 3 Encounters:  07/19/24 169 lb (76.7 kg)  02/25/24 161 lb (73 kg)  06/05/23 155 lb (70.3 kg)      Physical Exam   No results found for any visits on 07/19/24.  Last CBC Lab Results  Component Value Date   WBC 8.6 02/25/2024   HGB 11.2 02/25/2024   HCT 34.7 02/25/2024   MCV 89 02/25/2024   MCH 28.6 02/25/2024   RDW 16.4 (H) 02/25/2024   PLT 391 02/25/2024   Last metabolic panel Lab Results  Component Value Date   GLUCOSE 84 02/25/2024   NA 143 02/25/2024   K 3.3 (L) 02/25/2024   CL 103 02/25/2024   CO2 24 02/25/2024   BUN 15 02/25/2024   CREATININE 1.07 (H) 02/25/2024   EGFR 61 02/25/2024   CALCIUM  9.2 02/25/2024   PROT 6.7 02/25/2024   ALBUMIN 4.1 02/25/2024   LABGLOB 2.6 02/25/2024   BILITOT 0.2 02/25/2024   ALKPHOS 115 02/25/2024   AST 10 02/25/2024   ALT 13 02/25/2024   Last lipids Lab Results  Component Value Date   CHOL 163 02/25/2024  HDL 44 02/25/2024   LDLCALC 76 02/25/2024   TRIG 262 (H) 02/25/2024   CHOLHDL 2.9 04/04/2022   Last hemoglobin A1c No results found for: HGBA1C Last thyroid functions Lab Results  Component Value Date   TSH 5.720 (H) 03/20/2023   Last vitamin D No results found for: 25OHVITD2, 25OHVITD3, VD25OH Last vitamin B12 and Folate No results found for: VITAMINB12, FOLATE    The 10-year ASCVD risk score (Arnett DK, et al., 2019) is: 5.8%  Assessment & Plan:  There are no diagnoses linked to this encounter.   No follow-ups on file.   Benton LITTIE Gave, PA

## 2024-07-19 NOTE — Patient Instructions (Addendum)
 Stop cyclobenzaprine .  Start prednisone  and baclofen . Do the exercises attached to this form for piriformis syndrome.  Go to [address] for an xray.  Please follow up in 2 weeks, sooner if needed

## 2024-08-10 ENCOUNTER — Other Ambulatory Visit: Payer: Self-pay | Admitting: Family Medicine

## 2024-08-10 DIAGNOSIS — Z1231 Encounter for screening mammogram for malignant neoplasm of breast: Secondary | ICD-10-CM

## 2024-08-19 ENCOUNTER — Ambulatory Visit
Admission: RE | Admit: 2024-08-19 | Discharge: 2024-08-19 | Disposition: A | Discharge status: Home / Self Care | Source: Ambulatory Visit | Attending: Family Medicine | Admitting: Family Medicine

## 2024-08-19 DIAGNOSIS — Z1231 Encounter for screening mammogram for malignant neoplasm of breast: Secondary | ICD-10-CM

## 2024-09-02 ENCOUNTER — Other Ambulatory Visit: Payer: Self-pay | Admitting: Family Medicine

## 2024-09-02 DIAGNOSIS — K219 Gastro-esophageal reflux disease without esophagitis: Secondary | ICD-10-CM
